# Patient Record
Sex: Female | Born: 1965 | Race: Black or African American | Hispanic: No | Marital: Married | Smoking: Current every day smoker
Health system: Southern US, Community
[De-identification: ages and names within clinical notes are randomized; demographics above are authoritative.]

## PROBLEM LIST (undated history)

## (undated) DIAGNOSIS — K259 Gastric ulcer, unspecified as acute or chronic, without hemorrhage or perforation: Secondary | ICD-10-CM

## (undated) DIAGNOSIS — M1990 Unspecified osteoarthritis, unspecified site: Secondary | ICD-10-CM

## (undated) DIAGNOSIS — H9190 Unspecified hearing loss, unspecified ear: Secondary | ICD-10-CM

## (undated) DIAGNOSIS — B059 Measles without complication: Secondary | ICD-10-CM

## (undated) DIAGNOSIS — E7800 Pure hypercholesterolemia, unspecified: Secondary | ICD-10-CM

## (undated) DIAGNOSIS — M810 Age-related osteoporosis without current pathological fracture: Secondary | ICD-10-CM

## (undated) DIAGNOSIS — E119 Type 2 diabetes mellitus without complications: Secondary | ICD-10-CM

## (undated) DIAGNOSIS — R42 Dizziness and giddiness: Secondary | ICD-10-CM

## (undated) DIAGNOSIS — G8929 Other chronic pain: Secondary | ICD-10-CM

## (undated) DIAGNOSIS — B269 Mumps without complication: Secondary | ICD-10-CM

## (undated) DIAGNOSIS — K219 Gastro-esophageal reflux disease without esophagitis: Secondary | ICD-10-CM

## (undated) DIAGNOSIS — I2510 Atherosclerotic heart disease of native coronary artery without angina pectoris: Secondary | ICD-10-CM

## (undated) HISTORY — PX: ABDOMINAL HYSTERECTOMY: SHX81

## (undated) HISTORY — PX: UTERINE FIBROID SURGERY: SHX826

## (undated) HISTORY — PX: TUBAL LIGATION: SHX77

## (undated) HISTORY — PX: OTHER SURGICAL HISTORY: SHX169

---

## 1998-05-21 ENCOUNTER — Emergency Department (HOSPITAL_COMMUNITY): Admission: EM | Admit: 1998-05-21 | Discharge: 1998-05-21 | Payer: Self-pay | Admitting: Emergency Medicine

## 1998-05-23 ENCOUNTER — Emergency Department (HOSPITAL_COMMUNITY): Admission: EM | Admit: 1998-05-23 | Discharge: 1998-05-23 | Payer: Self-pay | Admitting: Emergency Medicine

## 1998-05-24 ENCOUNTER — Emergency Department (HOSPITAL_COMMUNITY): Admission: EM | Admit: 1998-05-24 | Discharge: 1998-05-24 | Payer: Self-pay | Admitting: Emergency Medicine

## 1999-04-29 ENCOUNTER — Encounter: Payer: Self-pay | Admitting: Gastroenterology

## 1999-04-29 ENCOUNTER — Ambulatory Visit (HOSPITAL_COMMUNITY): Admission: RE | Admit: 1999-04-29 | Discharge: 1999-04-29 | Payer: Self-pay | Admitting: Gastroenterology

## 1999-05-04 ENCOUNTER — Ambulatory Visit (HOSPITAL_COMMUNITY): Admission: RE | Admit: 1999-05-04 | Discharge: 1999-05-04 | Payer: Self-pay | Admitting: Gastroenterology

## 1999-05-16 ENCOUNTER — Ambulatory Visit (HOSPITAL_COMMUNITY): Admission: RE | Admit: 1999-05-16 | Discharge: 1999-05-16 | Payer: Self-pay | Admitting: Gastroenterology

## 1999-05-16 ENCOUNTER — Encounter: Payer: Self-pay | Admitting: Gastroenterology

## 1999-05-16 ENCOUNTER — Inpatient Hospital Stay (HOSPITAL_COMMUNITY): Admission: EM | Admit: 1999-05-16 | Discharge: 1999-05-17 | Payer: Self-pay | Admitting: Emergency Medicine

## 1999-07-22 ENCOUNTER — Emergency Department (HOSPITAL_COMMUNITY): Admission: EM | Admit: 1999-07-22 | Discharge: 1999-07-23 | Payer: Self-pay

## 1999-12-06 ENCOUNTER — Emergency Department (HOSPITAL_COMMUNITY): Admission: EM | Admit: 1999-12-06 | Discharge: 1999-12-06 | Payer: Self-pay

## 1999-12-16 ENCOUNTER — Emergency Department (HOSPITAL_COMMUNITY): Admission: EM | Admit: 1999-12-16 | Discharge: 1999-12-16 | Payer: Self-pay | Admitting: *Deleted

## 2000-01-09 ENCOUNTER — Emergency Department (HOSPITAL_COMMUNITY): Admission: EM | Admit: 2000-01-09 | Discharge: 2000-01-09 | Payer: Self-pay | Admitting: Emergency Medicine

## 2000-08-22 ENCOUNTER — Other Ambulatory Visit: Admission: RE | Admit: 2000-08-22 | Discharge: 2000-08-22 | Payer: Self-pay | Admitting: Obstetrics and Gynecology

## 2000-08-22 ENCOUNTER — Encounter (INDEPENDENT_AMBULATORY_CARE_PROVIDER_SITE_OTHER): Payer: Self-pay

## 2000-11-17 ENCOUNTER — Emergency Department (HOSPITAL_COMMUNITY): Admission: EM | Admit: 2000-11-17 | Discharge: 2000-11-17 | Payer: Self-pay | Admitting: Emergency Medicine

## 2002-05-01 ENCOUNTER — Emergency Department (HOSPITAL_COMMUNITY): Admission: EM | Admit: 2002-05-01 | Discharge: 2002-05-01 | Payer: Self-pay | Admitting: Emergency Medicine

## 2002-05-01 ENCOUNTER — Encounter: Payer: Self-pay | Admitting: Emergency Medicine

## 2002-10-20 ENCOUNTER — Other Ambulatory Visit: Admission: RE | Admit: 2002-10-20 | Discharge: 2002-10-20 | Payer: Self-pay | Admitting: Obstetrics and Gynecology

## 2003-01-12 ENCOUNTER — Encounter (INDEPENDENT_AMBULATORY_CARE_PROVIDER_SITE_OTHER): Payer: Self-pay | Admitting: Specialist

## 2003-01-12 ENCOUNTER — Observation Stay (HOSPITAL_COMMUNITY): Admission: RE | Admit: 2003-01-12 | Discharge: 2003-01-13 | Payer: Self-pay | Admitting: Obstetrics and Gynecology

## 2008-09-08 ENCOUNTER — Emergency Department (HOSPITAL_COMMUNITY): Admission: EM | Admit: 2008-09-08 | Discharge: 2008-09-08 | Payer: Self-pay | Admitting: Emergency Medicine

## 2011-05-05 NOTE — H&P (Signed)
NAME:  Sylvia [name], [name]                          ACCOUNT NO.:  [id number]   MEDICAL RECORD NO.:  [id number]                   PATIENT TYPE:  AMB   LOCATION:  SDC                                  FACILITY:  [facility]   PHYSICIAN:  Janine Limbo, M.D.            DATE OF BIRTH:  22-Sep-1966   DATE OF ADMISSION:  01/12/2003  DATE OF DISCHARGE:                                HISTORY & PHYSICAL   HISTORY OF PRESENT ILLNESS:  The patient is a 45 year old female, para 2-0-2-  2, who presents for vaginal hysterectomy because of large fibroids with  menorrhagia, dysmenorrhea, irregular cycles, and anemia.  The patient had an  ultrasound performed which showed an 11 cm uterus with multiple fibroids.  The largest fibroid measured 4.1 x 3.4 cm.  The ovaries appeared normal on  that ultrasound.  The patient is currently taking iron and her hemoglobin  has been as low as 11.9.  Her gonorrhea and Chlamydia cultures were  negative.  An endometrial biopsy showed benign elements.  The patient tried  nonsteroidal anti-inflammatory agents and also hormonal therapy to control  her discomfort.  She continued to have her difficulties and now she wants to  proceed with hysterectomy.  The patient has had a left ectopic pregnancy  removed via laparoscopy.   OBSTETRICAL HISTORY:  The patient has had two term vaginal deliveries.  She  has had one left ectopic pregnancy removed via laparoscopy and one  miscarriage.   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  She had a  broken ankle in 1997.   DRUG ALLERGIES:  The patient is allergic to STEROID MEDICATIONS, DEMEROL,  and CONTRAST DYE.   SOCIAL HISTORY:  The patient smokes one half pack of cigarettes each day.  She drinks alcohol socially.  She denies recreational drug use.   REVIEW OF SYSTEMS:  The patient is deaf in her left ear as mentioned above.  She has a  history of frequent urinary tract infections.  She wears glasses  and contact lenses.   FAMILY HISTORY:  The patient's father had heart disease and hypertension.  Her grandmother also had hypertension.  Her sister had sickle cell anemia.   PHYSICAL EXAMINATION:  VITAL SIGNS:  Weight is 214 pounds.  HEENT:  Within normal limits.  CHEST:  Clear.  HEART:  Regular rate and rhythm.  BREASTS:  Without masses.  ABDOMEN:  Nontender.  EXTREMITIES:  Within normal limits.  NEUROLOGIC:  Grossly normal.  PELVIC:  External genitalia is normal.  The vagina is normal.  The cervix is  nontender.  The uterus is 12 weeks' size, irregular and firm.  Adnexa no  masses.  Rectovaginal exam confirms.   ASSESSMENT:  1. Fibroid uterus.  2. Menorrhagia.  3. Dysmenorrhea.  4. Anemia.   PLAN:  The  patient will undergo a vaginal hysterectomy.  She understands the  indications for her procedure and she accepts the risk of, but not limited  to, anesthetic complications, bleeding, infections, and possible damage to  the surrounding organs.                                               Janine Limbo, M.D.    AVS/MEDQ  D:  01/11/2003  T:  01/11/2003  Job:  [id number]

## 2011-05-05 NOTE — Op Note (Signed)
NAME:  [name], [name]                          ACCOUNT NO.:  [id number]   MEDICAL RECORD NO.:  [id number]                   PATIENT TYPE:  OBV   LOCATION:  9399                                 FACILITY:  [facility]   PHYSICIAN:  Janine Limbo, M.D.            DATE OF BIRTH:  [birth year]   DATE OF PROCEDURE:  01/12/2003  DATE OF DISCHARGE:                                 OPERATIVE REPORT   PREOPERATIVE DIAGNOSES:  1. Fibroid uterus.  2. Dysmenorrhea.  3. Menorrhagia.   POSTOPERATIVE DIAGNOSES:  1. Fibroid uterus.  2. Dysmenorrhea.  3. Menorrhagia.   PROCEDURE:  Vaginal hysterectomy with uterine morcellation.   SURGEON:  Janine Limbo, M.D.   ASSISTANT:  Marquis Lunch. Adline Peals.   ANESTHESIA:  General.   DISPOSITION:  The patient is a 45 year old female, para 2-0-2-2, who  presents with the above-mentioned diagnosis.  Nonsteroidal anti-inflammatory  agents and hormonal therapy have not relieved her discomfort.  She wishes to  proceed with definitive therapy.  She understands the indications for her  procedure and she accepts the risks of, but not limited to, anesthetic  complications, bleeding, infection, and possible damage to the surrounding  organs.   FINDINGS:  The patient was noted to have a 14 week size multifibroid uterus.  The ovaries appeared normal.   DESCRIPTION OF PROCEDURE:  The patient was taken to the operating room,  where a general anesthetic was given.  The patient's abdomen, perineum, and  vagina were prepped with multiple layers of Betadine.  A Foley catheter was  placed in the bladder.  The patient was then sterilely draped.  Examination  under anesthesia was performed.  The cervix was injected with a diluted  solution of Pitressin and saline.  A circumferential incision was made  around the cervix and the vaginal mucosa was advanced both anteriorly and  posteriorly.  The posterior cul-de-sac and then the anterior cul-de-sac were  sharply entered.   Alternating from right to left, the uterosacral ligaments,  paracervical tissues, parametrial tissues, and uterine arteries were  clamped, cut, sutured, and tied securely.  Attempts were made to invert the  uterus through the posterior colpotomy, but these were unsuccessful because  of the size of the uterus.  In a stepwise fashion, the posterior uterus was  incised and multiple fibroids were removed.  We were careful to keep the  uterus always under direct visualization and our incisions always under  direct visualization.  Once we had reached the fundus of  the uterus, we  were then able to invert the uterus through the posterior colpotomy.  The  upper pedicles were secured and the uterus was transected from the operative  field.  The upper pedicles were then free-tied and then suture ligated.  Figure-of-eight sutures of 0 Vicryl and then 2-0 Vicryl were used for  hemostasis.  Once we were certain that hemostasis was adequate, the sutures  attached to the uterosacral ligaments were brought out through the vaginal  angles and tied securely.  A McCall culdoplasty suture was placed in the  posterior cul-de-sac, incorporating the uterosacral ligaments bilaterally  and the posterior vaginal mucosa.  A final check was made for hemostasis,  and again hemostasis was confirmed.  The vaginal cuff was closed using  figure-of-eight incorporating the anterior vaginal mucosa, the anterior  peritoneum, the posterior peritoneum, and the posterior vaginal mucosa.  The  McCall culdoplasty suture was tied securely and the apex of the vagina was  noted to elevate into the mid-pelvis.  Sponge, needle, and instrument counts  were correct on two occasions.  The estimated blood loss was 200 mL.  Vicryl  0 was the suture material used except where otherwise mentioned.  The  patient was noted to drain clear yellow urine at the end of her procedure.  She was taken to the recovery room in stable condition.                                                Janine Limbo, M.D.    AVS/MEDQ  D:  01/12/2003  T:  01/12/2003  Job:  [id number]

## 2011-05-05 NOTE — Discharge Summary (Signed)
NAME:  Sylvia [name], Sylvia [name]                          ACCOUNT NO.:  [id number]   MEDICAL RECORD NO.:  [id number]                   PATIENT TYPE:  OBV   LOCATION:  9148                                 FACILITY:  [facility]   PHYSICIAN:  Janine Limbo, M.D.            DATE OF BIRTH:  06-24-1966   DATE OF ADMISSION:  01/12/2003  DATE OF DISCHARGE:  01/13/2003                                 DISCHARGE SUMMARY   DISCHARGE DIAGNOSES:  1. Fibroid uterus.  2. Menorrhagia.  3. Dysmenorrhea.   OPERATIONS:  On the date of admission the patient underwent [name] total vaginal  hysterectomy with uterine morcellations, tolerating the procedure well.  The  patient was found to have [name] multiple fibroid uterus enlarged to  approximately 14 weeks size, (250 grams), along with normal-appearing tubes  and ovaries.   HISTORY OF PRESENT ILLNESS:  The patient is [name] 45 year old female, para 2-0-2-  2, who presents for vaginal hysterectomy because of large fibroids with  menorrhagia, dysmenorrhea, irregular cycles and anemia.  Please see the  patient's dictated history and physical examination for details.   PHYSICAL EXAMINATION:  VITAL SIGNS:  Weight 214 pounds.  GENERAL:  Within normal limits.  PELVIC:  External genitalia is normal.  Vagina is normal.  Cervix is  nontender.  Uterus is approximately 12 week size, irregular and firm.  Adnexa without masses, rectovaginal exam confirms.   HOSPITAL COURSE:  On the date of admission the patient underwent  aforementioned procedures, tolerating them well.  Postoperative course was  unremarkable with patient's postoperative hemoglobin being 10.1  (preoperative hemoglobin 12.6).  The patient had resumed bowel and bladder  function by postoperative day one and therefore deemed ready for discharge  home.   DISCHARGE MEDICATIONS:  1. Ibuprofen 600 mg 1 tablet with food every 6 hours for 5 days, then as     needed for pain.  2. Colace 100 mg 1 tablet twice daily until bowel  movements are regular.  3. Vicodin 1-2 tablets every 4-6 hours as needed for pain.  4. Phenergan 12.5 mg 1 tablet every 6 hours as needed for nausea.  5. Iron 325 mg 1 tablet twice daily for 6 weeks.   FOLLOWUP:  The patient is scheduled for [name] six weeks postoperative exam with  Dr. Stefano Gaul on 02/24/03 at 3 o'clock p.m.   DISCHARGE INSTRUCTIONS:  1. The patient was given [name] copy of [facility] and     Gynecology Postoperative Instruction Sheet.  2. She was further advised to avoid driving for two weeks, heavy lifting for     four weeks, and intercourse for six     weeks.  3. The patient's diet was without restriction.  4. Final pathology was not available at the time of the patient's discharge.     Elmira J. Powell, P.[name].  Janine Limbo, M.D.    EJP/MEDQ  D:  01/13/2003  T:  01/13/2003  Job:  [id number]

## 2012-12-18 HISTORY — PX: JOINT REPLACEMENT: SHX530

## 2015-04-30 ENCOUNTER — Ambulatory Visit: Payer: Medicare PPO | Admitting: Internal Medicine

## 2015-04-30 DIAGNOSIS — Z0289 Encounter for other administrative examinations: Secondary | ICD-10-CM

## 2015-09-08 ENCOUNTER — Emergency Department (HOSPITAL_BASED_OUTPATIENT_CLINIC_OR_DEPARTMENT_OTHER)
Admission: EM | Admit: 2015-09-08 | Discharge: 2015-09-08 | Disposition: A | Discharge status: Home / Self Care | Payer: Medicare PPO | Attending: Emergency Medicine | Admitting: Emergency Medicine

## 2015-09-08 ENCOUNTER — Encounter (HOSPITAL_BASED_OUTPATIENT_CLINIC_OR_DEPARTMENT_OTHER): Payer: Self-pay

## 2015-09-08 DIAGNOSIS — X58XXXA Exposure to other specified factors, initial encounter: Secondary | ICD-10-CM | POA: Diagnosis not present

## 2015-09-08 DIAGNOSIS — Y998 Other external cause status: Secondary | ICD-10-CM | POA: Diagnosis not present

## 2015-09-08 DIAGNOSIS — S161XXA Strain of muscle, fascia and tendon at neck level, initial encounter: Secondary | ICD-10-CM | POA: Insufficient documentation

## 2015-09-08 DIAGNOSIS — I2510 Atherosclerotic heart disease of native coronary artery without angina pectoris: Secondary | ICD-10-CM | POA: Insufficient documentation

## 2015-09-08 DIAGNOSIS — Y9389 Activity, other specified: Secondary | ICD-10-CM | POA: Diagnosis not present

## 2015-09-08 DIAGNOSIS — Y9289 Other specified places as the place of occurrence of the external cause: Secondary | ICD-10-CM | POA: Diagnosis not present

## 2015-09-08 DIAGNOSIS — E119 Type 2 diabetes mellitus without complications: Secondary | ICD-10-CM | POA: Insufficient documentation

## 2015-09-08 DIAGNOSIS — M542 Cervicalgia: Secondary | ICD-10-CM | POA: Diagnosis present

## 2015-09-08 HISTORY — DX: Type 2 diabetes mellitus without complications: E11.9

## 2015-09-08 HISTORY — DX: Pure hypercholesterolemia, unspecified: E78.00

## 2015-09-08 HISTORY — DX: Atherosclerotic heart disease of native coronary artery without angina pectoris: I25.10

## 2015-09-08 MED ORDER — KETOROLAC TROMETHAMINE 60 MG/2ML IM SOLN
60.0000 mg | Freq: Once | INTRAMUSCULAR | Status: AC
  Administered 2015-09-08: 60 mg via INTRAMUSCULAR
  Filled 2015-09-08: qty 2

## 2015-09-08 MED ORDER — METHOCARBAMOL 1000 MG/10ML IJ SOLN
1000.0000 mg | Freq: Once | INTRAMUSCULAR | Status: AC
  Administered 2015-09-08: 1000 mg via INTRAMUSCULAR
  Filled 2015-09-08: qty 10

## 2015-09-08 MED ORDER — METHOCARBAMOL 500 MG PO TABS: 500.0000 mg | ORAL_TABLET | Freq: Three times a day (TID) | ORAL | Status: DC | PRN

## 2015-09-08 MED ORDER — IBUPROFEN 400 MG PO TABS: 400.0000 mg | ORAL_TABLET | Freq: Four times a day (QID) | ORAL | Status: DC | PRN

## 2015-09-08 NOTE — ED Notes (Signed)
Pt c/o neck and shoulder pain since Sunday; denies injury, states slept wrong; pt in tears from pain, states pain on any movement

## 2015-09-08 NOTE — ED Provider Notes (Signed)
CSN: [id number]     Arrival date & time 09/08/15  0622 History   First MD Initiated Contact with Patient 09/08/15 [phone]     Chief Complaint  Patient presents with  . Neck Pain     (Consider location/radiation/quality/duration/timing/severity/associated sxs/prior Treatment) The history is provided by the patient and the spouse.   49 yo F w/ 3 days of worsening neck, trapezius pain without neurologic symptoms or fever. Seen at [facility] recently and dc w/ norco which didn't help. No other associated symptoms. Feels similar to previous muscle strains.   Past Medical History  Diagnosis Date  . Diabetes mellitus without complication   . High cholesterol   . Coronary artery disease    Past Surgical History  Procedure Laterality Date  . Abdominal hysterectomy     No family history on file. Social History  Substance Use Topics  . Smoking status: Never Smoker   . Smokeless tobacco: None  . Alcohol Use: Yes   OB History    No data available     Review of Systems  Constitutional: Negative for fever and chills.  Eyes: Negative for pain and itching.  Respiratory: Negative for shortness of breath (not recently).   Endocrine: Negative for polydipsia and polyuria.  Musculoskeletal: Positive for myalgias, back pain and neck pain.  All other systems reviewed and are negative.     Allergies  Contrast media and Demerol  Home Medications   Prior to Admission medications   Medication Sig Start Date End Date Taking? Authorizing Provider  ibuprofen (ADVIL,MOTRIN) 400 MG tablet Take 1 tablet (400 mg total) by mouth every 6 (six) hours as needed. 09/08/15   Marily Memos, MD  methocarbamol (ROBAXIN) 500 MG tablet Take 1 tablet (500 mg total) by mouth every 8 (eight) hours as needed for muscle spasms. 09/08/15   Barbara Cower Mesner, MD   BP 161/94 mmHg  Pulse 102  Temp(Src) 98.3 F (36.8 C) (Oral)  Resp 20  Ht  (1.6 m)  Wt 240 lb (108.863 kg)  BMI 42.52 kg/m2  SpO2 98% Physical Exam   Constitutional: She is oriented to person, place, and time. She appears well-developed and well-nourished.  HENT:  Head: Normocephalic and atraumatic.  Eyes: Conjunctivae and EOM are normal. Right eye exhibits no discharge. Left eye exhibits no discharge.  Cardiovascular: Normal rate and regular rhythm.   Pulmonary/Chest: Effort normal and breath sounds normal. No respiratory distress.  Abdominal: Soft. She exhibits no distension. There is no tenderness. There is no rebound.  Musculoskeletal: Normal range of motion. She exhibits no edema or tenderness.  Neurological: She is alert and oriented to person, place, and time.  Skin: Skin is warm and dry.  Nursing note and vitals reviewed.   ED Course  Procedures (including critical care time) Labs Review Labs Reviewed - No data to display  Imaging Review No results found. I have personally reviewed and evaluated these images and lab results as part of my medical decision-making.   EKG Interpretation None      MDM   Final diagnoses:  Neck strain, initial encounter   Likely MSK strain. Doubt vertebral dissection, spinal or vertebral pathology. Will tx symptomatically. Patient has to go to work and doesn't want anything sedating.   Pain improved with symptomatic treatment. Ambulating around room without difficulty. No neuro changes. Likely strain.     Marily Memos, MD 09/08/15 [phone]

## 2016-01-24 ENCOUNTER — Ambulatory Visit: Payer: Self-pay | Admitting: Orthopedic Surgery

## 2016-01-24 NOTE — Progress Notes (Signed)
Preoperative surgical orders have been place into the Epic hospital system for [employer] on 01/24/2016, 11:30 AM  by Patrica Duel for surgery on 03-01-16.  Preop Total Hip - Anterior Approach orders including IV Tylenol, and IV Decadron as long as there are no contraindications to the above medications. Avel Peace, PA-C

## 2016-02-22 ENCOUNTER — Other Ambulatory Visit (HOSPITAL_COMMUNITY): Payer: Self-pay | Admitting: *Deleted

## 2016-02-22 NOTE — Patient Instructions (Addendum)
Sylvia [name]  02/22/2016   Your procedure is scheduled on: 03-01-16  Report to [facility]  [facility]  [facility] to 3rd floor to  Short Stay Center at 845 AM.  Call this number if you have problems the morning of surgery [phone]   Remember: ONLY 1 PERSON MAY GO WITH YOU TO SHORT STAY TO GET  READY MORNING OF YOUR SURGERY.  Do not eat food or drink liquids :After Midnight.     Take these medicines the morning of surgery with A SIP OF WATER:ATORVASTATIN (LIPITOR) DO NOT TAKE ANY DIABETIC MEDICATIONS DAY OF YOUR SURGERY                               You may not have any metal on your body including hair pins and              piercings  Do not wear jewelry, make-up, lotions, powders or perfumes, deodorant             Do not wear nail polish.  Do not shave  48 hours prior to surgery.              Men may shave face and neck.   Do not bring valuables to the hospital. [place] IS NOT             RESPONSIBLE   FOR VALUABLES.  Contacts, dentures or bridgework may not be worn into surgery.  Leave suitcase in the car. After surgery it may be brought to your room.                  Please read over the following fact sheets you were given: _____________________________________________________________________             [facility] - Preparing [facility] Before surgery, you can play an important role.  Because skin is not sterile, your skin needs to be as free of germs as possible.  You can reduce the number of germs on your skin by washing with CHG (chlorahexidine gluconate) soap before surgery.  CHG is an antiseptic cleaner which kills germs and bonds with the skin to continue killing germs even after washing. Please DO NOT use if you have an allergy to CHG or antibacterial soaps.  If your skin becomes reddened/irritated stop using the CHG and inform your nurse when you arrive at Short Stay. Do not shave (including legs and underarms) for at  least 48 hours prior to the first CHG shower.  You may shave your face/neck. Please follow these instructions carefully:  1.  Shower with CHG Soap the night before surgery and the  morning of Surgery.  2.  If you choose to wash your hair, wash your hair first as usual with your  normal  shampoo.  3.  After you shampoo, rinse your hair and body thoroughly to remove the  shampoo.                           4.  Use CHG as you would any other liquid soap.  You can apply chg directly  to the skin and wash                       Gently with a scrungie or  clean washcloth.  5.  Apply the CHG Soap to your body ONLY FROM THE NECK DOWN.   Do not use on face/ open                           Wound or open sores. Avoid contact with eyes, ears mouth and genitals (private parts).                       Wash face,  Genitals (private parts) with your normal soap.             6.  Wash thoroughly, paying special attention to the area where your surgery  will be performed.  7.  Thoroughly rinse your body with warm water from the neck down.  8.  DO NOT shower/wash with your normal soap after using and rinsing off  the CHG Soap.                9.  Pat yourself dry with a clean towel.            10.  Wear clean pajamas.            11.  Place clean sheets on your bed the night of your first shower and do not  sleep with pets. Day of Surgery : Do not apply any lotions/deodorants the morning of surgery.  Please wear clean clothes to the hospital/surgery center.  FAILURE TO FOLLOW THESE INSTRUCTIONS MAY RESULT IN THE CANCELLATION OF YOUR SURGERY PATIENT SIGNATURE_________________________________  NURSE SIGNATURE__________________________________  ________________________________________________________________________   Adam Phenix  An incentive spirometer is a tool that can help keep your lungs clear and active. This tool measures how well you are filling your lungs with each breath. Taking long deep breaths  may help reverse or decrease the chance of developing breathing (pulmonary) problems (especially infection) following:  A long period of time when you are unable to move or be active. BEFORE THE PROCEDURE   If the spirometer includes an indicator to show your best effort, your nurse or respiratory therapist will set it to a desired goal.  If possible, sit up straight or lean slightly forward. Try not to slouch.  Hold the incentive spirometer in an upright position. INSTRUCTIONS FOR USE   Sit on the edge of your bed if possible, or sit up as far as you can in bed or on a chair.  Hold the incentive spirometer in an upright position.  Breathe out normally.  Place the mouthpiece in your mouth and seal your lips tightly around it.  Breathe in slowly and as deeply as possible, raising the piston or the ball toward the top of the column.  Hold your breath for 3-5 seconds or for as long as possible. Allow the piston or ball to fall to the bottom of the column.  Remove the mouthpiece from your mouth and breathe out normally.  Rest for a few seconds and repeat Steps 1 through 7 at least 10 times every 1-2 hours when you are awake. Take your time and take a few normal breaths between deep breaths.  The spirometer may include an indicator to show your best effort. Use the indicator as a goal to work toward during each repetition.  After each set of 10 deep breaths, practice coughing to be sure your lungs are clear. If you have an incision (the cut made at the time of surgery), support your incision when coughing  by placing a pillow or rolled up towels firmly against it. Once you are able to get out of bed, walk around indoors and cough well. You may stop using the incentive spirometer when instructed by your caregiver.  RISKS AND COMPLICATIONS  Take your time so you do not get dizzy or light-headed.  If you are in pain, you may need to take or ask for pain medication before doing incentive  spirometry. It is harder to take a deep breath if you are having pain. AFTER USE  Rest and breathe slowly and easily.  It can be helpful to keep track of a log of your progress. Your caregiver can provide you with a simple table to help with this. If you are using the spirometer at home, follow these instructions: [place] IF:   You are having difficultly using the spirometer.  You have trouble using the spirometer as often as instructed.  Your pain medication is not giving enough relief while using the spirometer.  You develop fever of 100.5 F (38.1 C) or higher. SEEK IMMEDIATE MEDICAL CARE IF:   You cough up bloody sputum that had not been present before.  You develop fever of 102 F (38.9 C) or greater.  You develop worsening pain at or near the incision site. MAKE SURE YOU:   Understand these instructions.  Will watch your condition.  Will get help right away if you are not doing well or get worse. Document Released: 04/16/2007 Document Revised: 02/26/2012 Document Reviewed: 06/17/2007 ExitCare Patient Information 2014 ExitCare, [place].   ________________________________________________________________________  WHAT IS A BLOOD TRANSFUSION? Blood Transfusion Information  A transfusion is the replacement of blood or some of its parts. Blood is made up of multiple cells which provide different functions.  Red blood cells carry oxygen and are used for blood loss replacement.  White blood cells fight against infection.  Platelets control bleeding.  Plasma helps clot blood.  Other blood products are available for specialized needs, such as hemophilia or other clotting disorders. BEFORE THE TRANSFUSION  Who gives blood for transfusions?   Healthy volunteers who are fully evaluated to make sure their blood is safe. This is blood bank blood. Transfusion therapy is the safest it has ever been in the practice of medicine. Before blood is taken from a donor, a  complete history is taken to make sure that person has no history of diseases nor engages in risky social behavior (examples are intravenous drug use or sexual activity with multiple partners). The donor's travel history is screened to minimize risk of transmitting infections, such as malaria. The donated blood is tested for signs of infectious diseases, such as HIV and hepatitis. The blood is then tested to be sure it is compatible with you in order to minimize the chance of a transfusion reaction. If you or a relative donates blood, this is often done in anticipation of surgery and is not appropriate for emergency situations. It takes many days to process the donated blood. RISKS AND COMPLICATIONS Although transfusion therapy is very safe and saves many lives, the main dangers of transfusion include:   Getting an infectious disease.  Developing a transfusion reaction. This is an allergic reaction to something in the blood you were given. Every precaution is taken to prevent this. The decision to have a blood transfusion has been considered carefully by your caregiver before blood is given. Blood is not given unless the benefits outweigh the risks. AFTER THE TRANSFUSION  Right after receiving a  blood transfusion, you will usually feel much better and more energetic. This is especially true if your red blood cells have gotten low (anemic). The transfusion raises the level of the red blood cells which carry oxygen, and this usually causes an energy increase.  The nurse administering the transfusion will monitor you carefully for complications. HOME CARE INSTRUCTIONS  No special instructions are needed after a transfusion. You may find your energy is better. Speak with your caregiver about any limitations on activity for underlying diseases you may have. SEEK MEDICAL CARE IF:   Your condition is not improving after your transfusion.  You develop redness or irritation at the intravenous (IV)  site. SEEK IMMEDIATE MEDICAL CARE IF:  Any of the following symptoms occur over the next 12 hours:  Shaking chills.  You have a temperature by mouth above 102 F (38.9 C), not controlled by medicine.  Chest, back, or muscle pain.  People around you feel you are not acting correctly or are confused.  Shortness of breath or difficulty breathing.  Dizziness and fainting.  You get a rash or develop hives.  You have a decrease in urine output.  Your urine turns a dark color or changes to pink, red, or brown. Any of the following symptoms occur over the next 10 days:  You have a temperature by mouth above 102 F (38.9 C), not controlled by medicine.  Shortness of breath.  Weakness after normal activity.  The white part of the eye turns yellow (jaundice).  You have a decrease in the amount of urine or are urinating less often.  Your urine turns a dark color or changes to pink, red, or brown. Document Released: 12/01/2000 Document Revised: 02/26/2012 Document Reviewed: 07/20/2008 [facility] Patient Information 2014 [place], [place].  _______________________________________________________________________

## 2016-02-22 NOTE — Progress Notes (Signed)
[facility] 09-06-15 on chart e[facility] 09-06-15 [facility] chart

## 2016-02-23 ENCOUNTER — Encounter (HOSPITAL_COMMUNITY): Payer: Self-pay

## 2016-02-23 ENCOUNTER — Encounter (HOSPITAL_COMMUNITY)
Admission: RE | Admit: 2016-02-23 | Discharge: 2016-02-23 | Disposition: A | Discharge status: Home / Self Care | Payer: Medicare PPO | Source: Ambulatory Visit | Attending: Orthopedic Surgery | Admitting: Orthopedic Surgery

## 2016-02-23 DIAGNOSIS — Z0183 Encounter for blood typing: Secondary | ICD-10-CM | POA: Insufficient documentation

## 2016-02-23 DIAGNOSIS — Z01818 Encounter for other preprocedural examination: Secondary | ICD-10-CM | POA: Diagnosis present

## 2016-02-23 DIAGNOSIS — M1612 Unilateral primary osteoarthritis, left hip: Secondary | ICD-10-CM | POA: Diagnosis not present

## 2016-02-23 DIAGNOSIS — Z01812 Encounter for preprocedural laboratory examination: Secondary | ICD-10-CM | POA: Insufficient documentation

## 2016-02-23 HISTORY — DX: Other chronic pain: G89.29

## 2016-02-23 HISTORY — DX: Gastro-esophageal reflux disease without esophagitis: K21.9

## 2016-02-23 HISTORY — DX: Age-related osteoporosis without current pathological fracture: M81.0

## 2016-02-23 HISTORY — DX: Mumps without complication: B26.9

## 2016-02-23 HISTORY — DX: Unspecified osteoarthritis, unspecified site: M19.90

## 2016-02-23 HISTORY — DX: Measles without complication: B05.9

## 2016-02-23 HISTORY — DX: Gastric ulcer, unspecified as acute or chronic, without hemorrhage or perforation: K25.9

## 2016-02-23 HISTORY — DX: Unspecified hearing loss, unspecified ear: H91.90

## 2016-02-23 HISTORY — DX: Dizziness and giddiness: R42

## 2016-02-23 LAB — URINALYSIS, ROUTINE W REFLEX MICROSCOPIC
Bilirubin Urine: NEGATIVE
GLUCOSE, UA: NEGATIVE mg/dL
HGB URINE DIPSTICK: NEGATIVE
Ketones, ur: NEGATIVE mg/dL
Leukocytes, UA: NEGATIVE
Nitrite: NEGATIVE
PH: 5.5 (ref 5.0–8.0)
Protein, ur: NEGATIVE mg/dL
SPECIFIC GRAVITY, URINE: 1.015 (ref 1.005–1.030)

## 2016-02-23 LAB — COMPREHENSIVE METABOLIC PANEL
ALK PHOS: 71 U/L (ref 38–126)
ALT: 29 U/L (ref 14–54)
ANION GAP: 10 (ref 5–15)
AST: 23 U/L (ref 15–41)
Albumin: 3.5 g/dL (ref 3.5–5.0)
BILIRUBIN TOTAL: 0.7 mg/dL (ref 0.3–1.2)
BUN: 14 mg/dL (ref 6–20)
CALCIUM: 9.3 mg/dL (ref 8.9–10.3)
CO2: 27 mmol/L (ref 22–32)
Chloride: 105 mmol/L (ref 101–111)
Creatinine, Ser: 1 mg/dL (ref 0.44–1.00)
Glucose, Bld: 158 mg/dL — ABNORMAL HIGH (ref 65–99)
Potassium: 4.1 mmol/L (ref 3.5–5.1)
Sodium: 142 mmol/L (ref 135–145)
TOTAL PROTEIN: 7.7 g/dL (ref 6.5–8.1)

## 2016-02-23 LAB — PROTIME-INR
INR: 1.12 (ref 0.00–1.49)
PROTHROMBIN TIME: 14.2 s (ref 11.6–15.2)

## 2016-02-23 LAB — CBC
HEMATOCRIT: 37.3 % (ref 36.0–46.0)
HEMOGLOBIN: 11.7 g/dL — AB (ref 12.0–15.0)
MCH: 28.1 pg (ref 26.0–34.0)
MCHC: 31.4 g/dL (ref 30.0–36.0)
MCV: 89.7 fL (ref 78.0–100.0)
Platelets: 276 10*3/uL (ref 150–400)
RBC: 4.16 MIL/uL (ref 3.87–5.11)
RDW: 14.3 % (ref 11.5–15.5)
WBC: 8 10*3/uL (ref 4.0–10.5)

## 2016-02-23 LAB — APTT: aPTT: 28 seconds (ref 24–37)

## 2016-02-23 LAB — SURGICAL PCR SCREEN
MRSA, PCR: NEGATIVE
Staphylococcus aureus: POSITIVE — AB

## 2016-02-23 LAB — ABO/RH: ABO/RH(D): A POS

## 2016-02-24 LAB — HEMOGLOBIN A1C
Hgb A1c MFr Bld: 6.9 % — ABNORMAL HIGH (ref 4.8–5.6)
MEAN PLASMA GLUCOSE: 151 mg/dL

## 2016-02-29 ENCOUNTER — Ambulatory Visit: Payer: Self-pay | Admitting: Orthopedic Surgery

## 2016-02-29 NOTE — H&P (Signed)
Sylvia [name] DOB: 07/31/1966 Married / Language: English / Race: White Female Date of Admission:  03/01/2016 CC:  Left Hip Pain History of Present Illness  The patient is a 50 year old female who comes in today for a preoperative History and Physical. The patient is scheduled for a left total hip arthroplasty (anterior) to be performed by Dr. Gus RankinFrank V. Aluisio, MD at [facility] on 03-01-2016. The patient is a 50 year old female who presented with a hip problem. The patient was seen referral from Dr. Bernita Raisinavid [place].The patient reports left lateral hip problems including pain symptoms that have been present for 2 year(s). The symptoms began without any known injury. Symptoms reported include hip pain, weakness, stiffness, catching, difficulty flexing hip and difficulty ambulating The patient reports symptoms radiating to the: left groin and left knee (and occasionally to her foot).The patient feels as if their symptoms are does feel they are worsening. Symptoms are exacerbated by flexing hip. Prior to being seen, the patient was previously evaluated by a primary physician. Previous treatment for this problem has included nonsteroidal anti-inflammatory drugs (Voltaren 75mg ) and opioid analgesics (hydrocodone; along with Tizanidine as needed. She reports she is out of the pain medication). She had her other hip [place] [facility]. She states she did well with that. She would prefer to have any further surgery [place] in [place]. The left hip is bothering her at all times now. Says it is as bad as the right one was prior to when she had that replaced. She is hurting at night. She cannot get around and do what she desires. She would like to exercise and lose weight, but cannot do so because of this left hip. She has got advanced end stage arthritis of the left hip with progressively worsening pain and dysfunction. At this point, the most predictable means of improving her  pain and function would be total hip arthroplasty. They have been treated conservatively in the past for the above stated problem and despite conservative measures, they continue to have progressive pain and severe functional limitations and dysfunction. They have failed non-operative management including home exercise, medications. It is felt that they would benefit from undergoing total joint replacement. Risks and benefits of the procedure have been discussed with the patient and they elect to proceed with surgery. There are no active contraindications to surgery such as ongoing infection or rapidly progressive neurological disease.  Problem List/Past Medical  Pain of left hip joint (M25.552)  Primary osteoarthritis of left hip (M16.12)  Chronic Pain  Diabetes Mellitus, Type II  Hypercholesterolemia  Osteoarthritis  Osteoporosis  Vertigo  Impaired Hearing  Hypercholesterolemia  Gastric Ulcer  Gastroesophageal Reflux Disease  Degenerative Disc Disease  Measles  Mumps  Allergies No Known Drug Allergies  Intolerances Demerol *ANALGESICS - OPIOID*  Vomiting. Contrast Media Ready-Box *MEDICAL DEVICES AND SUPPLIES*  Convulsions  Family History Cancer  Maternal Grandmother. Drug / Alcohol Addiction  Brother, Father, Maternal Grandmother, Sister. Osteoarthritis  Father. Rheumatoid Arthritis  Father.  Social History  Children  2 Current work status  disabled Exercise  Exercises daily; does running / walking Former drinker  10/28/2015: In the past drank wine only occasionally per week Living situation  live with spouse Marital status  married No history of drug/alcohol rehab  Not under pain contract  Number of flights of stairs before winded  2-3 Tobacco use  Former smoker. 10/28/2015: smoke(d) less than 1/2 pack(s) per day Post-Surgical Plans  Home  Medication History Diclofenac (  Tablet, Oral) Active. GlipiZIDE XL (  Tablet ER 24HR,  Oral) Active. Calcium + D (548 147 5159-40MG -UNT-MCG Tablet Chewable, Oral) Active. Atorvastatin Calcium (  Tablet, Oral daily) Active.  Past Surgical History Hysterectomy  partial (non-cancerous) Spinal Surgery  Disc Surgery Total Hip Replacement  right Tubal Ligation  Fibroid Surgery     Review of Systems General Not Present- Chills, Fatigue, Fever, Memory Loss, Night Sweats, Weight Gain and Weight Loss. Skin Not Present- Eczema, Hives, Itching, Lesions and Rash. HEENT Not Present- Dentures, Double Vision, Headache, Hearing Loss, Tinnitus and Visual Loss. Respiratory Not Present- Allergies, Chronic Cough, Coughing up blood, Shortness of breath at rest and Shortness of breath with exertion. Cardiovascular Not Present- Chest Pain, Difficulty Breathing Lying Down, Murmur, Palpitations, Racing/skipping heartbeats and Swelling. Gastrointestinal Not Present- Abdominal Pain, Bloody Stool, Constipation, Diarrhea, Difficulty Swallowing, Heartburn, Jaundice, Loss of appetitie, Nausea and Vomiting. Female Genitourinary Not Present- Blood in Urine, Discharge, Flank Pain, Incontinence, Painful Urination, Urgency, Urinary frequency, Urinary Retention, Urinating at Night and Weak urinary stream. Musculoskeletal Not Present- Back Pain, Joint Pain, Joint Swelling, Morning Stiffness, Muscle Pain, Muscle Weakness and Spasms. Neurological Not Present- Blackout spells, Difficulty with balance, Dizziness, Paralysis, Tremor and Weakness. Psychiatric Not Present- Insomnia.  Vitals  Height: 63in Pulse: 88 (Regular)  Resp.: 20 (Unlabored)  BP: 126/62 (Sitting, Left Arm, Standard)       Physical Exam General Mental Status -Alert, cooperative and good historian. General Appearance-pleasant, Not in acute distress. Orientation-Oriented X3. Build & Nutrition-Well nourished and Well developed.  Head and Neck Head-normocephalic, atraumatic . Neck Global Assessment - supple, no  bruit auscultated on the right, no bruit auscultated on the left.  Eye Vision-Wears corrective lenses. Pupil - Bilateral-Regular and Round. Motion - Bilateral-EOMI.  Chest and Lung Exam Auscultation Breath sounds - clear at anterior chest wall and clear at posterior chest wall. Adventitious sounds - No Adventitious sounds.  Cardiovascular Auscultation Rhythm - Regular rate and rhythm. Heart Sounds - S1 WNL and S2 WNL. Murmurs & Other Heart Sounds - Auscultation of the heart reveals - No Murmurs.  Abdomen Palpation/Percussion Tenderness - Abdomen is non-tender to palpation. Rigidity (guarding) - Abdomen is soft. Auscultation Auscultation of the abdomen reveals - Bowel sounds normal.  Female Genitourinary Note: Not done, not pertinent to present illness   Musculoskeletal Note: On exam, she is alert and oriented, in no apparent distress. Right hip can flexed to about 100, rotate in 20, out 30, abduct 30 without discomfort. Left hip flexion to 90, no internal or external rotation, about 20 degrees of abduction. Knee exam is unremarkable. She has intact pulses and sensation. Motor is intact. Gait pattern is significantly antalgic on the left.  RADIOGRAPHS AP of the pelvis and lateral of the left hip shows severe end stage arthritis, bone-on-bone near ankylosed joint. The right hip replacements in good position, no abnormalities.   Assessment & Plan  Primary osteoarthritis of left hip (M16.12)  Note:Surgical Plans: Left Total Hip Replacement - Anterior Approach  Disposition: Home  PCP: Dr. Bernita Raisin  IV TXA  Anesthesia Issues: None  Signed electronically by Beckey Rutter, III PA-C

## 2016-03-01 ENCOUNTER — Inpatient Hospital Stay (HOSPITAL_COMMUNITY)
Admission: AD | Admit: 2016-03-01 | Discharge: 2016-03-03 | DRG: 470 | Disposition: A | Discharge status: Home Health | Payer: Medicare PPO | Source: Ambulatory Visit | Attending: Orthopedic Surgery | Admitting: Orthopedic Surgery

## 2016-03-01 ENCOUNTER — Inpatient Hospital Stay (HOSPITAL_COMMUNITY): Payer: Medicare PPO

## 2016-03-01 ENCOUNTER — Inpatient Hospital Stay (HOSPITAL_COMMUNITY): Payer: Medicare PPO | Admitting: Anesthesiology

## 2016-03-01 ENCOUNTER — Encounter (HOSPITAL_COMMUNITY): Payer: Self-pay | Admitting: *Deleted

## 2016-03-01 ENCOUNTER — Encounter (HOSPITAL_COMMUNITY)
Admission: AD | Disposition: A | Discharge status: Home Health | Payer: Self-pay | Source: Ambulatory Visit | Attending: Orthopedic Surgery

## 2016-03-01 DIAGNOSIS — K219 Gastro-esophageal reflux disease without esophagitis: Secondary | ICD-10-CM | POA: Diagnosis present

## 2016-03-01 DIAGNOSIS — M810 Age-related osteoporosis without current pathological fracture: Secondary | ICD-10-CM | POA: Diagnosis present

## 2016-03-01 DIAGNOSIS — M25552 Pain in left hip: Secondary | ICD-10-CM | POA: Diagnosis present

## 2016-03-01 DIAGNOSIS — E7800 Pure hypercholesterolemia, unspecified: Secondary | ICD-10-CM | POA: Diagnosis present

## 2016-03-01 DIAGNOSIS — Z79899 Other long term (current) drug therapy: Secondary | ICD-10-CM | POA: Diagnosis not present

## 2016-03-01 DIAGNOSIS — G8929 Other chronic pain: Secondary | ICD-10-CM | POA: Diagnosis present

## 2016-03-01 DIAGNOSIS — E119 Type 2 diabetes mellitus without complications: Secondary | ICD-10-CM | POA: Diagnosis present

## 2016-03-01 DIAGNOSIS — H9190 Unspecified hearing loss, unspecified ear: Secondary | ICD-10-CM | POA: Diagnosis present

## 2016-03-01 DIAGNOSIS — Z01812 Encounter for preprocedural laboratory examination: Secondary | ICD-10-CM | POA: Diagnosis not present

## 2016-03-01 DIAGNOSIS — Z96641 Presence of right artificial hip joint: Secondary | ICD-10-CM | POA: Diagnosis present

## 2016-03-01 DIAGNOSIS — Z87891 Personal history of nicotine dependence: Secondary | ICD-10-CM | POA: Diagnosis not present

## 2016-03-01 DIAGNOSIS — M1612 Unilateral primary osteoarthritis, left hip: Principal | ICD-10-CM | POA: Diagnosis present

## 2016-03-01 DIAGNOSIS — Z6841 Body Mass Index (BMI) 40.0 and over, adult: Secondary | ICD-10-CM | POA: Diagnosis not present

## 2016-03-01 DIAGNOSIS — M169 Osteoarthritis of hip, unspecified: Secondary | ICD-10-CM | POA: Diagnosis present

## 2016-03-01 DIAGNOSIS — I2510 Atherosclerotic heart disease of native coronary artery without angina pectoris: Secondary | ICD-10-CM | POA: Diagnosis present

## 2016-03-01 DIAGNOSIS — Z8261 Family history of arthritis: Secondary | ICD-10-CM | POA: Diagnosis not present

## 2016-03-01 DIAGNOSIS — Z96649 Presence of unspecified artificial hip joint: Secondary | ICD-10-CM

## 2016-03-01 HISTORY — PX: TOTAL HIP ARTHROPLASTY: SHX124

## 2016-03-01 LAB — TYPE AND SCREEN
ABO/RH(D): A POS
Antibody Screen: NEGATIVE

## 2016-03-01 LAB — GLUCOSE, CAPILLARY
GLUCOSE-CAPILLARY: 116 mg/dL — AB (ref 65–99)
GLUCOSE-CAPILLARY: 200 mg/dL — AB (ref 65–99)
Glucose-Capillary: 159 mg/dL — ABNORMAL HIGH (ref 65–99)
Glucose-Capillary: 140 mg/dL — ABNORMAL HIGH (ref 65–99)

## 2016-03-01 SURGERY — ARTHROPLASTY, HIP, TOTAL, ANTERIOR APPROACH
Anesthesia: Spinal | Site: Hip | Laterality: Left

## 2016-03-01 MED ORDER — METOCLOPRAMIDE HCL 10 MG PO TABS: 5.0000 mg | ORAL_TABLET | Freq: Three times a day (TID) | ORAL | Status: DC | PRN

## 2016-03-01 MED ORDER — DIPHENHYDRAMINE HCL 12.5 MG/5ML PO ELIX: 12.5000 mg | ORAL_SOLUTION | ORAL | Status: DC | PRN

## 2016-03-01 MED ORDER — PHENOL 1.4 % MT LIQD: 1.0000 | OROMUCOSAL | Status: DC | PRN

## 2016-03-01 MED ORDER — HYDROMORPHONE HCL 1 MG/ML IJ SOLN: 0.2500 mg | INTRAMUSCULAR | Status: DC | PRN

## 2016-03-01 MED ORDER — SODIUM CHLORIDE 0.9 % IV SOLN
1000.0000 mg | Freq: Once | INTRAVENOUS | Status: AC
  Administered 2016-03-01: 1000 mg via INTRAVENOUS
  Filled 2016-03-01: qty 10

## 2016-03-01 MED ORDER — PHENYLEPHRINE HCL 10 MG/ML IJ SOLN
10.0000 mg | INTRAMUSCULAR | Status: DC | PRN
  Administered 2016-03-01: 80 ug/min via INTRAVENOUS

## 2016-03-01 MED ORDER — MIDAZOLAM HCL 2 MG/2ML IJ SOLN
INTRAMUSCULAR | Status: AC
  Filled 2016-03-01: qty 2

## 2016-03-01 MED ORDER — FENTANYL CITRATE (PF) 100 MCG/2ML IJ SOLN
INTRAMUSCULAR | Status: DC | PRN
  Administered 2016-03-01: 50 ug via INTRAVENOUS
  Administered 2016-03-01: 100 ug via INTRAVENOUS
  Administered 2016-03-01: 50 ug via INTRAVENOUS

## 2016-03-01 MED ORDER — PROPOFOL 10 MG/ML IV BOLUS
INTRAVENOUS | Status: DC | PRN
  Administered 2016-03-01: 200 mg via INTRAVENOUS

## 2016-03-01 MED ORDER — GLIPIZIDE ER 5 MG PO TB24
5.0000 mg | ORAL_TABLET | Freq: Every day | ORAL | Status: DC
  Administered 2016-03-02 – 2016-03-03 (×2): 5 mg via ORAL
  Filled 2016-03-01 (×3): qty 1

## 2016-03-01 MED ORDER — RIVAROXABAN 10 MG PO TABS
10.0000 mg | ORAL_TABLET | Freq: Every day | ORAL | Status: DC
  Administered 2016-03-02 – 2016-03-03 (×2): 10 mg via ORAL
  Filled 2016-03-01 (×3): qty 1

## 2016-03-01 MED ORDER — DEXAMETHASONE SODIUM PHOSPHATE 10 MG/ML IJ SOLN
INTRAMUSCULAR | Status: AC
  Filled 2016-03-01: qty 1

## 2016-03-01 MED ORDER — BISACODYL 10 MG RE SUPP
10.0000 mg | Freq: Every day | RECTAL | Status: DC | PRN
  Administered 2016-03-03: 10 mg via RECTAL
  Filled 2016-03-01 (×2): qty 1

## 2016-03-01 MED ORDER — ACETAMINOPHEN 500 MG PO TABS
1000.0000 mg | ORAL_TABLET | Freq: Four times a day (QID) | ORAL | Status: AC
  Administered 2016-03-01 – 2016-03-02 (×4): 1000 mg via ORAL
  Filled 2016-03-01 (×4): qty 2

## 2016-03-01 MED ORDER — OXYCODONE HCL 5 MG PO TABS
5.0000 mg | ORAL_TABLET | ORAL | Status: DC | PRN
  Administered 2016-03-01: 10 mg via ORAL
  Administered 2016-03-01: 5 mg via ORAL
  Administered 2016-03-01: 10 mg via ORAL
  Administered 2016-03-01: 5 mg via ORAL
  Administered 2016-03-02 (×4): 10 mg via ORAL
  Filled 2016-03-01 (×3): qty 2
  Filled 2016-03-01: qty 1
  Filled 2016-03-01 (×2): qty 2
  Filled 2016-03-01: qty 1
  Filled 2016-03-01: qty 2

## 2016-03-01 MED ORDER — ATORVASTATIN CALCIUM 80 MG PO TABS
80.0000 mg | ORAL_TABLET | Freq: Every day | ORAL | Status: DC
  Administered 2016-03-02 – 2016-03-03 (×2): 80 mg via ORAL
  Filled 2016-03-01 (×2): qty 1

## 2016-03-01 MED ORDER — POLYETHYLENE GLYCOL 3350 17 G PO PACK
17.0000 g | PACK | Freq: Every day | ORAL | Status: DC | PRN
  Administered 2016-03-03: 17 g via ORAL
  Filled 2016-03-01: qty 1

## 2016-03-01 MED ORDER — CEFAZOLIN SODIUM-DEXTROSE 2-3 GM-% IV SOLR
INTRAVENOUS | Status: AC
  Filled 2016-03-01: qty 50

## 2016-03-01 MED ORDER — DEXAMETHASONE SODIUM PHOSPHATE 10 MG/ML IJ SOLN
10.0000 mg | Freq: Once | INTRAMUSCULAR | Status: AC
  Administered 2016-03-01: 10 mg via INTRAVENOUS

## 2016-03-01 MED ORDER — SUCCINYLCHOLINE CHLORIDE 20 MG/ML IJ SOLN
INTRAMUSCULAR | Status: DC | PRN
  Administered 2016-03-01: 100 mg via INTRAVENOUS

## 2016-03-01 MED ORDER — LACTATED RINGERS IV SOLN: INTRAVENOUS | Status: DC

## 2016-03-01 MED ORDER — TRANEXAMIC ACID 1000 MG/10ML IV SOLN
1000.0000 mg | INTRAVENOUS | Status: AC
  Administered 2016-03-01: 1000 mg via INTRAVENOUS
  Filled 2016-03-01: qty 10

## 2016-03-01 MED ORDER — CEFAZOLIN SODIUM-DEXTROSE 2-3 GM-% IV SOLR
2.0000 g | INTRAVENOUS | Status: AC
  Administered 2016-03-01: 2 g via INTRAVENOUS

## 2016-03-01 MED ORDER — ONDANSETRON HCL 4 MG/2ML IJ SOLN
INTRAMUSCULAR | Status: DC | PRN
  Administered 2016-03-01: 4 mg via INTRAVENOUS

## 2016-03-01 MED ORDER — ROCURONIUM BROMIDE 100 MG/10ML IV SOLN
INTRAVENOUS | Status: DC | PRN
  Administered 2016-03-01: 50 mg via INTRAVENOUS

## 2016-03-01 MED ORDER — PHENYLEPHRINE HCL 10 MG/ML IJ SOLN
INTRAMUSCULAR | Status: DC | PRN
  Administered 2016-03-01: 80 ug via INTRAVENOUS
  Administered 2016-03-01: 120 ug via INTRAVENOUS

## 2016-03-01 MED ORDER — HYDROMORPHONE HCL 1 MG/ML IJ SOLN
INTRAMUSCULAR | Status: DC | PRN
  Administered 2016-03-01 (×2): 1 mg via INTRAVENOUS

## 2016-03-01 MED ORDER — MORPHINE SULFATE (PF) 2 MG/ML IV SOLN: 1.0000 mg | INTRAVENOUS | Status: DC | PRN

## 2016-03-01 MED ORDER — ONDANSETRON HCL 4 MG/2ML IJ SOLN
INTRAMUSCULAR | Status: AC
  Filled 2016-03-01: qty 2

## 2016-03-01 MED ORDER — BUPIVACAINE HCL (PF) 0.25 % IJ SOLN
INTRAMUSCULAR | Status: AC
  Filled 2016-03-01: qty 30

## 2016-03-01 MED ORDER — DEXAMETHASONE SODIUM PHOSPHATE 10 MG/ML IJ SOLN
10.0000 mg | Freq: Once | INTRAMUSCULAR | Status: AC
  Administered 2016-03-02: 10 mg via INTRAVENOUS
  Filled 2016-03-01: qty 1

## 2016-03-01 MED ORDER — FENTANYL CITRATE (PF) 100 MCG/2ML IJ SOLN
INTRAMUSCULAR | Status: AC
  Filled 2016-03-01: qty 2

## 2016-03-01 MED ORDER — METOCLOPRAMIDE HCL 5 MG/ML IJ SOLN: 5.0000 mg | Freq: Three times a day (TID) | INTRAMUSCULAR | Status: DC | PRN

## 2016-03-01 MED ORDER — ACETAMINOPHEN 325 MG PO TABS: 650.0000 mg | ORAL_TABLET | Freq: Four times a day (QID) | ORAL | Status: DC | PRN

## 2016-03-01 MED ORDER — MEPERIDINE HCL 50 MG/ML IJ SOLN: 6.2500 mg | INTRAMUSCULAR | Status: DC | PRN

## 2016-03-01 MED ORDER — SODIUM CHLORIDE 0.9 % IV SOLN: INTRAVENOUS | Status: DC

## 2016-03-01 MED ORDER — FLEET ENEMA 7-19 GM/118ML RE ENEM: 1.0000 | ENEMA | Freq: Once | RECTAL | Status: DC | PRN

## 2016-03-01 MED ORDER — PROMETHAZINE HCL 25 MG/ML IJ SOLN: 6.2500 mg | INTRAMUSCULAR | Status: DC | PRN

## 2016-03-01 MED ORDER — SUGAMMADEX SODIUM 200 MG/2ML IV SOLN
INTRAVENOUS | Status: DC | PRN
  Administered 2016-03-01: 200 mg via INTRAVENOUS

## 2016-03-01 MED ORDER — LIDOCAINE HCL (CARDIAC) 20 MG/ML IV SOLN
INTRAVENOUS | Status: AC
  Filled 2016-03-01: qty 5

## 2016-03-01 MED ORDER — TRAMADOL HCL 50 MG PO TABS: 50.0000 mg | ORAL_TABLET | Freq: Four times a day (QID) | ORAL | Status: DC | PRN

## 2016-03-01 MED ORDER — LACTATED RINGERS IV SOLN
INTRAVENOUS | Status: DC | PRN
  Administered 2016-03-01 (×2): via INTRAVENOUS

## 2016-03-01 MED ORDER — INSULIN ASPART 100 UNIT/ML ~~LOC~~ SOLN: 0.0000 [IU] | Freq: Three times a day (TID) | SUBCUTANEOUS | Status: DC

## 2016-03-01 MED ORDER — PROPOFOL 10 MG/ML IV BOLUS
INTRAVENOUS | Status: AC
  Filled 2016-03-01: qty 60

## 2016-03-01 MED ORDER — MIDAZOLAM HCL 5 MG/5ML IJ SOLN
INTRAMUSCULAR | Status: DC | PRN
  Administered 2016-03-01: 2 mg via INTRAVENOUS

## 2016-03-01 MED ORDER — HYDROMORPHONE HCL 1 MG/ML IJ SOLN
INTRAMUSCULAR | Status: AC
  Filled 2016-03-01: qty 1

## 2016-03-01 MED ORDER — ACETAMINOPHEN 650 MG RE SUPP: 650.0000 mg | Freq: Four times a day (QID) | RECTAL | Status: DC | PRN

## 2016-03-01 MED ORDER — ACETAMINOPHEN 10 MG/ML IV SOLN
INTRAVENOUS | Status: AC
  Filled 2016-03-01: qty 100

## 2016-03-01 MED ORDER — MENTHOL 3 MG MT LOZG: 1.0000 | LOZENGE | OROMUCOSAL | Status: DC | PRN

## 2016-03-01 MED ORDER — ONDANSETRON HCL 4 MG/2ML IJ SOLN
4.0000 mg | Freq: Four times a day (QID) | INTRAMUSCULAR | Status: DC | PRN
  Administered 2016-03-02: 4 mg via INTRAVENOUS
  Filled 2016-03-01: qty 2

## 2016-03-01 MED ORDER — CEFAZOLIN SODIUM-DEXTROSE 2-3 GM-% IV SOLR
2.0000 g | Freq: Four times a day (QID) | INTRAVENOUS | Status: AC
  Administered 2016-03-01 – 2016-03-02 (×2): 2 g via INTRAVENOUS
  Filled 2016-03-01 (×2): qty 50

## 2016-03-01 MED ORDER — ACETAMINOPHEN 10 MG/ML IV SOLN
1000.0000 mg | Freq: Once | INTRAVENOUS | Status: AC
  Administered 2016-03-01: 1000 mg via INTRAVENOUS

## 2016-03-01 MED ORDER — BUPIVACAINE HCL (PF) 0.25 % IJ SOLN
INTRAMUSCULAR | Status: DC | PRN
  Administered 2016-03-01: 30 mL

## 2016-03-01 MED ORDER — DEXTROSE 5 % IV SOLN
500.0000 mg | Freq: Four times a day (QID) | INTRAVENOUS | Status: DC | PRN
  Administered 2016-03-01: 500 mg via INTRAVENOUS
  Filled 2016-03-01 (×2): qty 5

## 2016-03-01 MED ORDER — ROCURONIUM BROMIDE 100 MG/10ML IV SOLN
INTRAVENOUS | Status: AC
  Filled 2016-03-01: qty 1

## 2016-03-01 MED ORDER — ONDANSETRON HCL 4 MG PO TABS: 4.0000 mg | ORAL_TABLET | Freq: Four times a day (QID) | ORAL | Status: DC | PRN

## 2016-03-01 MED ORDER — 0.9 % SODIUM CHLORIDE (POUR BTL) OPTIME
TOPICAL | Status: DC | PRN
  Administered 2016-03-01: 1000 mL

## 2016-03-01 MED ORDER — HYDROMORPHONE HCL 2 MG/ML IJ SOLN
INTRAMUSCULAR | Status: AC
  Filled 2016-03-01: qty 1

## 2016-03-01 MED ORDER — LIDOCAINE HCL (CARDIAC) 20 MG/ML IV SOLN
INTRAVENOUS | Status: DC | PRN
  Administered 2016-03-01: 75 mg via INTRAVENOUS

## 2016-03-01 MED ORDER — DOCUSATE SODIUM 100 MG PO CAPS
100.0000 mg | ORAL_CAPSULE | Freq: Two times a day (BID) | ORAL | Status: DC
  Administered 2016-03-01 – 2016-03-03 (×4): 100 mg via ORAL

## 2016-03-01 MED ORDER — METHOCARBAMOL 500 MG PO TABS
500.0000 mg | ORAL_TABLET | Freq: Four times a day (QID) | ORAL | Status: DC | PRN
  Administered 2016-03-02 (×2): 500 mg via ORAL
  Filled 2016-03-01 (×2): qty 1

## 2016-03-01 SURGICAL SUPPLY — 36 items
BAG DECANTER FOR FLEXI CONT (MISCELLANEOUS) ×3 IMPLANT
BAG SPEC THK2 15X12 ZIP CLS (MISCELLANEOUS)
BAG ZIPLOCK 12X15 (MISCELLANEOUS) IMPLANT
BLADE SAG 18X100X1.27 (BLADE) ×3 IMPLANT
CAPT HIP TOTAL 2 ×3 IMPLANT
CLOSURE WOUND 1/2 X4 (GAUZE/BANDAGES/DRESSINGS) ×1
CLOTH BEACON ORANGE TIMEOUT ST (SAFETY) ×3 IMPLANT
COVER PERINEAL POST (MISCELLANEOUS) ×3 IMPLANT
DECANTER SPIKE VIAL GLASS SM (MISCELLANEOUS) ×3 IMPLANT
DRAPE STERI IOBAN 125X83 (DRAPES) ×3 IMPLANT
DRAPE U-SHAPE 47X51 STRL (DRAPES) ×6 IMPLANT
DRSG ADAPTIC 3X8 NADH LF (GAUZE/BANDAGES/DRESSINGS) ×3 IMPLANT
DRSG MEPILEX BORDER 4X4 (GAUZE/BANDAGES/DRESSINGS) ×3 IMPLANT
DRSG MEPILEX BORDER 4X8 (GAUZE/BANDAGES/DRESSINGS) ×3 IMPLANT
DURAPREP 26ML APPLICATOR (WOUND CARE) ×3 IMPLANT
ELECT REM PT RETURN 9FT ADLT (ELECTROSURGICAL) ×3
ELECTRODE REM PT RTRN 9FT ADLT (ELECTROSURGICAL) ×1 IMPLANT
EVACUATOR 1/8 PVC DRAIN (DRAIN) ×3 IMPLANT
GLOVE BIO SURGEON STRL SZ7.5 (GLOVE) ×3 IMPLANT
GLOVE BIO SURGEON STRL SZ8 (GLOVE) ×6 IMPLANT
GLOVE BIOGEL PI IND STRL 8 (GLOVE) ×2 IMPLANT
GLOVE BIOGEL PI INDICATOR 8 (GLOVE) ×4
GOWN STRL REUS W/TWL LRG LVL3 (GOWN DISPOSABLE) ×3 IMPLANT
GOWN STRL REUS W/TWL XL LVL3 (GOWN DISPOSABLE) ×3 IMPLANT
PACK ANTERIOR HIP CUSTOM (KITS) ×3 IMPLANT
STRIP CLOSURE SKIN 1/2X4 (GAUZE/BANDAGES/DRESSINGS) ×2 IMPLANT
SUT ETHIBOND NAB CT1 #1 30IN (SUTURE) ×3 IMPLANT
SUT MNCRL AB 4-0 PS2 18 (SUTURE) ×6 IMPLANT
SUT VIC AB 2-0 CT1 27 (SUTURE) ×6
SUT VIC AB 2-0 CT1 TAPERPNT 27 (SUTURE) ×2 IMPLANT
SUT VLOC 180 0 24IN GS25 (SUTURE) ×6 IMPLANT
SYR 50ML LL SCALE MARK (SYRINGE) IMPLANT
TRAY FOLEY W/METER SILVER 14FR (SET/KITS/TRAYS/PACK) ×3 IMPLANT
TRAY FOLEY W/METER SILVER 16FR (SET/KITS/TRAYS/PACK) IMPLANT
WATER STERILE IRR 1000ML POUR (IV SOLUTION) ×6 IMPLANT
YANKAUER SUCT BULB TIP 10FT TU (MISCELLANEOUS) ×3 IMPLANT

## 2016-03-01 NOTE — Anesthesia Preprocedure Evaluation (Signed)
Anesthesia Evaluation  Patient identified by MRN, date of birth, ID band Patient awake    Reviewed: Allergy & Precautions, NPO status , Patient's Chart, lab work & pertinent test results  Airway Mallampati: II  TM Distance: >3 FB Neck ROM: Full    Dental no notable dental hx.    Pulmonary former smoker,    Pulmonary exam normal breath sounds clear to auscultation       Cardiovascular + CAD  Normal cardiovascular exam Rhythm:Regular Rate:Normal     Neuro/Psych negative neurological ROS  negative psych ROS   GI/Hepatic Neg liver ROS, PUD, GERD  ,  Endo/Other  diabetes, Type 2, Oral Hypoglycemic Agents  Renal/GU negative Renal ROS     Musculoskeletal  (+) Arthritis ,   Abdominal   Peds  Hematology negative hematology ROS (+)   Anesthesia Other Findings   Reproductive/Obstetrics negative OB ROS                             Anesthesia Physical Anesthesia Plan  ASA: III  Anesthesia Plan: Spinal   Post-op Pain Management:    Induction:   Airway Management Planned:   Additional Equipment:   Intra-op Plan:   Post-operative Plan:   Informed Consent: I have reviewed the patients History and Physical, chart, labs and discussed the procedure including the risks, benefits and alternatives for the proposed anesthesia with the patient or authorized representative who has indicated his/her understanding and acceptance.   Dental advisory given  Plan Discussed with: CRNA  Anesthesia Plan Comments:         Anesthesia Quick Evaluation

## 2016-03-01 NOTE — Anesthesia Procedure Notes (Signed)
Procedure Name: Intubation Date/Time: 03/01/2016 11:27 AM Performed by: Elyn PeersALLEN, Denai Caba J Pre-anesthesia Checklist: Patient identified, Emergency Drugs available, Suction available, Patient being monitored and Timeout performed Patient Re-evaluated:Patient Re-evaluated prior to inductionOxygen Delivery Method: Circle system utilized Preoxygenation: Pre-oxygenation with 100% oxygen Intubation Type: IV induction Ventilation: Mask ventilation without difficulty Laryngoscope Size: Glidescope and 4 Grade View: Grade I Tube type: Parker flex tip Tube size: 7.5 mm Number of attempts: 1 Airway Equipment and Method: Stylet Placement Confirmation: ETT inserted through vocal cords under direct vision,  positive ETCO2 and breath sounds checked- equal and bilateral Secured at: 22 cm Tube secured with: Tape Dental Injury: Teeth and Oropharynx as per pre-operative assessment  Difficulty Due To: Difficult Airway- due to large tongue and Difficulty was anticipated

## 2016-03-01 NOTE — Anesthesia Postprocedure Evaluation (Signed)
Anesthesia Post Note  Patient: Sylvia [name]  Procedure(s) Performed: Procedure(s) (LRB): LEFT TOTAL HIP ARTHROPLASTY ANTERIOR APPROACH (Left)  Patient location during evaluation: PACU Anesthesia Type: General Level of consciousness: sedated and patient cooperative Pain management: pain level controlled Vital Signs Assessment: post-procedure vital signs reviewed and stable Respiratory status: spontaneous breathing Cardiovascular status: stable Anesthetic complications: no    Last Vitals:  Filed Vitals:   03/01/16 1534 03/01/16 1634  BP: 144/75 143/79  Pulse: 96 94  Temp: 36.7 C 36.7 C  Resp: 15 16    Last Pain:  Filed Vitals:   03/01/16 1638  PainSc: 2                  [name]

## 2016-03-01 NOTE — H&P (View-Only) (Signed)
Sylvia [name] DOB: 03/22/1966 Married / Language: English / Race: White Female Date of Admission:  03/01/2016 CC:  Left Hip Pain History of Present Illness  The patient is a 50 year old female who comes in today for a preoperative History and Physical. The patient is scheduled for a left total hip arthroplasty (anterior) to be performed by Dr. [name], MD at [facility] on 03-01-2016. The patient is a 50 year old female who presented with a hip problem. The patient was seen referral from Dr. David [name].The patient reports left lateral hip problems including pain symptoms that have been present for 2 year(s). The symptoms began without any known injury. Symptoms reported include hip pain, weakness, stiffness, catching, difficulty flexing hip and difficulty ambulating The patient reports symptoms radiating to the: left groin and left knee (and occasionally to her foot).The patient feels as if their symptoms are does feel they are worsening. Symptoms are exacerbated by flexing hip. Prior to being seen, the patient was previously evaluated by a primary physician. Previous treatment for this problem has included nonsteroidal anti-inflammatory drugs (Voltaren 75mg) and opioid analgesics (hydrocodone; along with Tizanidine as needed. She reports she is out of the pain medication). She had her other hip replaced by Dr. [name] in [place]. She states she did well with that. She would prefer to have any further surgery down here in [place]. The left hip is bothering her at all times now. Says it is as bad as the right one was prior to when she had that replaced. She is hurting at night. She cannot get around and do what she desires. She would like to exercise and lose weight, but cannot do so because of this left hip. She has got advanced end stage arthritis of the left hip with progressively worsening pain and dysfunction. At this point, the most predictable means of improving her  pain and function would be total hip arthroplasty. They have been treated conservatively in the past for the above stated problem and despite conservative measures, they continue to have progressive pain and severe functional limitations and dysfunction. They have failed non-operative management including home exercise, medications. It is felt that they would benefit from undergoing total joint replacement. Risks and benefits of the procedure have been discussed with the patient and they elect to proceed with surgery. There are no active contraindications to surgery such as ongoing infection or rapidly progressive neurological disease.  Problem List/Past Medical  Pain of left hip joint (M25.552)  Primary osteoarthritis of left hip (M16.12)  Chronic Pain  Diabetes Mellitus, Type II  Hypercholesterolemia  Osteoarthritis  Osteoporosis  Vertigo  Impaired Hearing  Hypercholesterolemia  Gastric Ulcer  Gastroesophageal Reflux Disease  Degenerative Disc Disease  Measles  Mumps  Allergies No Known Drug Allergies  Intolerances Demerol *ANALGESICS - OPIOID*  Vomiting. Contrast Media Ready-Box *MEDICAL DEVICES AND SUPPLIES*  Convulsions  Family History Cancer  Maternal Grandmother. Drug / Alcohol Addiction  Brother, Father, Maternal Grandmother, Sister. Osteoarthritis  Father. Rheumatoid Arthritis  Father.  Social History  Children  2 Current work status  disabled Exercise  Exercises daily; does running / walking Former drinker  10/28/2015: In the past drank wine only occasionally per week Living situation  live with spouse Marital status  married No history of drug/alcohol rehab  Not under pain contract  Number of flights of stairs before winded  2-3 Tobacco use  Former smoker. 10/28/2015: smoke(d) less than 1/2 pack(s) per day Post-Surgical Plans    Home  Medication History Diclofenac (75MG Tablet, Oral) Active. GlipiZIDE XL (5MG Tablet ER 24HR,  Oral) Active. Calcium + D (500-1000-40MG-UNT-MCG Tablet Chewable, Oral) Active. Atorvastatin Calcium (80MG Tablet, Oral daily) Active.  Past Surgical History Hysterectomy  partial (non-cancerous) Spinal Surgery  Disc Surgery Total Hip Replacement  right Tubal Ligation  Fibroid Surgery     Review of Systems General Not Present- Chills, Fatigue, Fever, Memory Loss, Night Sweats, Weight Gain and Weight Loss. Skin Not Present- Eczema, Hives, Itching, Lesions and Rash. HEENT Not Present- Dentures, Double Vision, Headache, Hearing Loss, Tinnitus and Visual Loss. Respiratory Not Present- Allergies, Chronic Cough, Coughing up blood, Shortness of breath at rest and Shortness of breath with exertion. Cardiovascular Not Present- Chest Pain, Difficulty Breathing Lying Down, Murmur, Palpitations, Racing/skipping heartbeats and Swelling. Gastrointestinal Not Present- Abdominal Pain, Bloody Stool, Constipation, Diarrhea, Difficulty Swallowing, Heartburn, Jaundice, Loss of appetitie, Nausea and Vomiting. Female Genitourinary Not Present- Blood in Urine, Discharge, Flank Pain, Incontinence, Painful Urination, Urgency, Urinary frequency, Urinary Retention, Urinating at Night and Weak urinary stream. Musculoskeletal Not Present- Back Pain, Joint Pain, Joint Swelling, Morning Stiffness, Muscle Pain, Muscle Weakness and Spasms. Neurological Not Present- Blackout spells, Difficulty with balance, Dizziness, Paralysis, Tremor and Weakness. Psychiatric Not Present- Insomnia.  Vitals  Height: 63in Pulse: 88 (Regular)  Resp.: 20 (Unlabored)  BP: 126/62 (Sitting, Left Arm, Standard)       Physical Exam General Mental Status -Alert, cooperative and good historian. General Appearance-pleasant, Not in acute distress. Orientation-Oriented X3. Build & Nutrition-Well nourished and Well developed.  Head and Neck Head-normocephalic, atraumatic . Neck Global Assessment - supple, no  bruit auscultated on the right, no bruit auscultated on the left.  Eye Vision-Wears corrective lenses. Pupil - Bilateral-Regular and Round. Motion - Bilateral-EOMI.  Chest and Lung Exam Auscultation Breath sounds - clear at anterior chest wall and clear at posterior chest wall. Adventitious sounds - No Adventitious sounds.  Cardiovascular Auscultation Rhythm - Regular rate and rhythm. Heart Sounds - S1 WNL and S2 WNL. Murmurs & Other Heart Sounds - Auscultation of the heart reveals - No Murmurs.  Abdomen Palpation/Percussion Tenderness - Abdomen is non-tender to palpation. Rigidity (guarding) - Abdomen is soft. Auscultation Auscultation of the abdomen reveals - Bowel sounds normal.  Female Genitourinary Note: Not done, not pertinent to present illness   Musculoskeletal Note: On exam, she is alert and oriented, in no apparent distress. Right hip can flexed to about 100, rotate in 20, out 30, abduct 30 without discomfort. Left hip flexion to 90, no internal or external rotation, about 20 degrees of abduction. Knee exam is unremarkable. She has intact pulses and sensation. Motor is intact. Gait pattern is significantly antalgic on the left.  RADIOGRAPHS AP of the pelvis and lateral of the left hip shows severe end stage arthritis, bone-on-bone near ankylosed joint. The right hip replacements in good position, no abnormalities.   Assessment & Plan  Primary osteoarthritis of left hip (M16.12)  Note:Surgical Plans: Left Total Hip Replacement - Anterior Approach  Disposition: Home  PCP: Dr. David [name]  IV TXA  Anesthesia Issues: None  Signed electronically by Alezandrew L Perkins, III PA-C 

## 2016-03-01 NOTE — Interval H&P Note (Signed)
History and Physical Interval Note:  03/01/2016 9:32 AM  Sylvia [name]  has presented today for surgery, with the diagnosis of OA LEFT HIP  The various methods of treatment have been discussed with the patient and family. After consideration of risks, benefits and other options for treatment, the patient has consented to  Procedure(s): LEFT TOTAL HIP ARTHROPLASTY ANTERIOR APPROACH (Left) as a surgical intervention .  The patient's history has been reviewed, patient examined, no change in status, stable for surgery.  I have reviewed the patient's chart and labs.  Questions were answered to the patient's satisfaction.     Loanne DrillingALUISIO,Dynver Clemson V

## 2016-03-01 NOTE — Transfer of Care (Signed)
Immediate Anesthesia Transfer of Care Note  Patient: Sylvia NearingLisa A [name]  Procedure(s) Performed: Procedure(s): LEFT TOTAL HIP ARTHROPLASTY ANTERIOR APPROACH (Left)  Patient Location: PACU  Anesthesia Type:General  Level of Consciousness:  sedated, patient cooperative and responds to stimulation  Airway & Oxygen Therapy:Patient Spontanous Breathing and Patient connected to face mask oxgen  Post-op Assessment:  Report given to PACU RN and Post -op Vital signs reviewed and stable  Post vital signs:  Reviewed and stable  Last Vitals:  Filed Vitals:   03/01/16 0841 03/01/16 1321  BP: 128/91   Pulse: 82   Temp: 36.5 C 36.7 C  Resp: 18     Complications: No apparent anesthesia complications

## 2016-03-01 NOTE — Op Note (Signed)
OPERATIVE REPORT  PREOPERATIVE DIAGNOSIS: Osteoarthritis of the Left hip.   POSTOPERATIVE DIAGNOSIS: Osteoarthritis of the Left  hip.   PROCEDURE: Left total hip arthroplasty, anterior approach.   SURGEON: Ollen GrossFrank Taleshia Luff, MD   ASSISTANT: Avel Peacerew Perkins, PA-C  ANESTHESIA:  General  ESTIMATED BLOOD LOSS:-500 ml   DRAINS: Hemovac x1.   COMPLICATIONS: None   CONDITION: PACU - hemodynamically stable.   BRIEF CLINICAL NOTE: Sylvia [name] is a 50 y.o. female who has advanced end-  stage arthritis of her Left  hip with progressively worsening pain and  dysfunction.The patient has failed nonoperative management and presents for  total hip arthroplasty.   PROCEDURE IN DETAIL: After successful administration of spinal  anesthetic, the traction boots for the [facility] bed were placed on both  feet and the patient was [facility] bed, boots placed into the leg  holders. The Left hip was then isolated from the perineum with plastic  drapes and prepped and draped in the usual sterile fashion. ASIS and  greater trochanter were marked and a oblique incision was made, starting  at about 1 cm lateral and 2 cm distal to the ASIS and coursing towards  the anterior cortex of the femur. The skin was cut with a 10 blade  through subcutaneous tissue to the level of the fascia overlying the  tensor fascia lata muscle. The fascia was then incised in line with the  incision at the junction of the anterior third and posterior 2/3rd. The  muscle was teased off the fascia and then the interval between the TFL  and the rectus was developed. The Hohmann retractor was then placed at  the top of the femoral neck over the capsule. The vessels overlying the  capsule were cauterized and the fat on top of the capsule was removed.  A Hohmann retractor was then placed anterior underneath the rectus  femoris to give exposure to the entire anterior capsule. A T-shaped  capsulotomy was performed. The  edges were tagged and the femoral head  was identified.       Osteophytes are removed off the superior acetabulum.  The femoral neck was then cut in situ with an oscillating saw. Traction  was then applied to the left lower extremity utilizing the [facility]  traction. The femoral head was then removed. Retractors were placed  around the acetabulum and then circumferential removal of the labrum was  performed. Osteophytes were also removed. Reaming starts at 45 mm to  medialize and  Increased in 2 mm increments to 49 mm. We reamed in  approximately 40 degrees of abduction, 20 degrees anteversion. A 50 mm  pinnacle acetabular shell was then impacted in anatomic position under  fluoroscopic guidance with excellent purchase. We did not need to place  any additional dome screws. A 32 mm neutral + 4 marathon liner was then  placed into the acetabular shell.       The femoral lift was then placed along the lateral aspect of the femur  just distal to the vastus ridge. The leg was  externally rotated and capsule  was stripped off the inferior aspect of the femoral neck down to the  level of the lesser trochanter, this was done with electrocautery. The femur was lifted after this was performed. The  leg was then placed and extended in adducted position to essentially delivering the femur. We also removed the capsule superiorly and the  piriformis from the piriformis  fossa to gain excellent exposure of the  proximal femur. Rongeur was used to remove some cancellous bone to get  into the lateral portion of the proximal femur for placement of the  initial starter reamer. The starter broaches was placed  the starter broach  and was shown to go down the center of the canal. Broaching  with the  Corail system was then performed starting at size 8, coursing  Up to size 11. A size 11 had excellent torsional and rotational  and axial stability. The trial standard offset neck was then placed  with a 32 + 1 trial  head. The hip was then reduced. We confirmed that  the stem was in the canal both on AP and lateral x-rays. It also has excellent sizing. The hip was reduced with outstanding stability through full extension, full external rotation,  and then flexion in adduction internal rotation. AP pelvis was taken  and the leg lengths were measured and found to be exactly equal. Hip  was then dislocated again and the femoral head and neck removed. The  femoral broach was removed. Size 11 Corail stem with a standard offset  neck was then impacted into the femur following native anteversion. Has  excellent purchase in the canal. Excellent torsional and rotational and  axial stability. It is confirmed to be in the canal on AP and lateral  fluoroscopic views. The 32 + 1 ceramic head was placed and the hip  reduced with outstanding stability. Again AP pelvis was taken and it  confirmed that the leg lengths were equal. The wound was then copiously  irrigated with saline solution and the capsule reattached and repaired  with Ethibond suture. 30 ml of .25% Bupivicaine injected into the capsule and into the edge of the tensor fascia lata as well as subcutaneous tissue. The fascia overlying the tensor fascia lata was  then closed with a running #1 V-Loc. Subcu was closed with interrupted  2-0 Vicryl and subcuticular running 4-0 Monocryl. Incision was cleaned  and dried. Steri-Strips and a bulky sterile dressing applied. Hemovac  drain was hooked to suction and then he was awakened and transported to  recovery in stable condition.        Please note that a surgical assistant was a medical necessity for this procedure to perform it in a safe and expeditious manner. Assistant was necessary to provide appropriate retraction of vital neurovascular structures and to prevent femoral fracture and allow for anatomic placement of the prosthesis.  Gaynelle Arabian, M.D.

## 2016-03-01 NOTE — Progress Notes (Signed)
6th floor aware pt will be ion 1601 in 20 minutes.  Pt on bed and needs pump and pulsoximetry.

## 2016-03-02 LAB — BASIC METABOLIC PANEL
Anion gap: 8 (ref 5–15)
BUN: 11 mg/dL (ref 6–20)
CO2: 25 mmol/L (ref 22–32)
Calcium: 8.7 mg/dL — ABNORMAL LOW (ref 8.9–10.3)
Chloride: 104 mmol/L (ref 101–111)
Creatinine, Ser: 0.96 mg/dL (ref 0.44–1.00)
GFR calc Af Amer: >60 mL/min (ref >60)
GFR calc non Af Amer: >60 mL/min (ref >60)
GLUCOSE: 178 mg/dL — AB (ref 65–99)
POTASSIUM: 4.6 mmol/L (ref 3.5–5.1)
SODIUM: 137 mmol/L (ref 135–145)

## 2016-03-02 LAB — CBC
HCT: 32.3 % — ABNORMAL LOW (ref 36.0–46.0)
Hemoglobin: 10.2 g/dL — ABNORMAL LOW (ref 12.0–15.0)
MCH: 28.3 pg (ref 26.0–34.0)
MCHC: 31.6 g/dL (ref 30.0–36.0)
MCV: 89.7 fL (ref 78.0–100.0)
PLATELETS: 274 10*3/uL (ref 150–400)
RBC: 3.6 MIL/uL — AB (ref 3.87–5.11)
RDW: 14.2 % (ref 11.5–15.5)
WBC: 11.6 10*3/uL — AB (ref 4.0–10.5)

## 2016-03-02 LAB — GLUCOSE, CAPILLARY
Glucose-Capillary: 134 mg/dL — ABNORMAL HIGH (ref 65–99)
Glucose-Capillary: 161 mg/dL — ABNORMAL HIGH (ref 65–99)
Glucose-Capillary: 169 mg/dL — ABNORMAL HIGH (ref 65–99)
Glucose-Capillary: 163 mg/dL — ABNORMAL HIGH (ref 65–99)

## 2016-03-02 MED ORDER — RIVAROXABAN 10 MG PO TABS: 10.0000 mg | ORAL_TABLET | Freq: Every day | ORAL | Status: DC

## 2016-03-02 MED ORDER — TRAMADOL HCL 50 MG PO TABS: 50.0000 mg | ORAL_TABLET | Freq: Four times a day (QID) | ORAL | Status: DC | PRN

## 2016-03-02 MED ORDER — METHOCARBAMOL 500 MG PO TABS: 500.0000 mg | ORAL_TABLET | Freq: Four times a day (QID) | ORAL | Status: DC | PRN

## 2016-03-02 MED ORDER — OXYCODONE HCL 5 MG PO TABS
5.0000 mg | ORAL_TABLET | ORAL | Status: DC | PRN
Start: 2016-03-02 — End: 2018-02-16

## 2016-03-02 MED ORDER — OXYCODONE HCL 5 MG PO TABS
10.0000 mg | ORAL_TABLET | ORAL | Status: DC | PRN
  Administered 2016-03-02 – 2016-03-03 (×2): 15 mg via ORAL
  Administered 2016-03-03: 10 mg via ORAL
  Administered 2016-03-03: 15 mg via ORAL
  Filled 2016-03-02 (×4): qty 3

## 2016-03-02 NOTE — Progress Notes (Signed)
   Subjective: 1 Day Post-Op Procedure(s) (LRB): LEFT TOTAL HIP ARTHROPLASTY ANTERIOR APPROACH (Left) Patient reports pain as mild.   Patient seen in rounds with Dr. Lequita HaltAluisio. Patient is well, but has had some minor complaints of pain in the hip, requiring pain medications We will start therapy today.  If they do well with therapy and meets all goals, then will allow home later this afternoon following therapy. Plan is to go Home after hospital stay.  Objective: Vital signs in last 24 hours: Temp:  [97.7 F (36.5 C)-98.4 F (36.9 C)] 98.3 F (36.8 C) (03/16 0619) Pulse Rate:  [82-98] 85 (03/16 0619) Resp:  [13-18] 16 (03/16 0619) BP: (120-159)/(62-92) 125/65 mmHg (03/16 0619) SpO2:  [99 %-100 %] 100 % (03/16 0619) Weight:  [115.849 kg (255 lb 6.4 oz)] 115.849 kg (255 lb 6.4 oz) (03/15 0834)  Intake/Output from previous day:  Intake/Output Summary (Last 24 hours) at 03/02/16 0801 Last data filed at 03/02/16 [id number]  Gross per 24 hour  Intake 3118.92 ml  Output   3280 ml  Net -161.08 ml    Intake/Output this shift: UOP 1550 since around MN  Labs:  Recent Labs  03/02/16 0408  HGB 10.2*    Recent Labs  03/02/16 0408  WBC 11.6*  RBC 3.60*  HCT 32.3*  PLT 274    Recent Labs  03/02/16 0408  NA 137  K 4.6  CL 104  CO2 25  BUN 11  CREATININE 0.96  GLUCOSE 178*  CALCIUM 8.7*   No results for input(s): LABPT, INR in the last 72 hours.  EXAM General - Patient is Alert, Appropriate and Oriented Extremity - Neurovascular intact Sensation intact distally Dorsiflexion/Plantar flexion intact Dressing - dressing C/D/I Motor Function - intact, moving foot and toes well on exam.  Hemovac pulled without difficulty.  Past Medical History  Diagnosis Date  . Diabetes mellitus without complication (HCC)   . High cholesterol   . Coronary artery disease   . Chronic pain   . Arthritis     oa  . Osteoporosis   . Vertigo   . Impaired hearing     left ear  . Gastric  ulcer yrs ago  . DJD (degenerative joint disease)   . Measles as child  . Mumps as child  . GERD (gastroesophageal reflux disease)     hx of, none recent    Assessment/Plan: 1 Day Post-Op Procedure(s) (LRB): LEFT TOTAL HIP ARTHROPLASTY ANTERIOR APPROACH (Left) Active Problems:   OA (osteoarthritis) of hip  Estimated body mass index is 44.53 kg/(m^2) as calculated from the following:   Height as of this encounter: 5' 3.5" (1.613 m).   Weight as of this encounter: 115.849 kg (255 lb 6.4 oz). Advance diet Up with therapy Discharge home with home health  DVT Prophylaxis - Xarelto Weight Bearing As Tolerated left Leg Hemovac Pulled Begin Therapy  If meets goals and able to go home: Up with therapy Discharge home with home health if meets goals and doing well Diet - Diabetic diet and Cardiac Diet Follow up - in 2 weeks Activity - WBAT Disposition - Home Condition Upon Discharge - Pending therapy results D/C Meds - See DC Summary DVT Prophylaxis - Xarelto  Avel Peacerew Lauris Keepers, PA-C Orthopaedic Surgery 03/02/2016, 8:01 AM

## 2016-03-02 NOTE — Progress Notes (Signed)
Physical Therapy Treatment Patient Details Name: Boykin NearingLisa A Cabell MRN: [id number] DOB: 11/29/1966 Today's Date: 03/02/2016    History of Present Illness s/p L DA THA    PT Comments    Steady progress with mobility - pt ltd by fatigue, pain and c/o leg length discrepancy.  Follow Up Recommendations  Home health PT     Equipment Recommendations  Rolling walker with 5" wheels    Recommendations for Other Services OT consult     Precautions / Restrictions Precautions Precautions: Fall Restrictions Weight Bearing Restrictions: No Other Position/Activity Restrictions: WBAT    Mobility  Bed Mobility Overal bed mobility: Needs Assistance Bed Mobility: Supine to Sit;Sit to Supine     Supine to sit: Min assist Sit to supine: Min assist   General bed mobility comments: cues for sequence and use of R LE to self assist.  Pt using rail to self assist  Transfers Overall transfer level: Needs assistance Equipment used: Rolling walker (2 wheeled) Transfers: Sit to/from Stand Sit to Stand: Min assist         General transfer comment: steadying assistance; assist to extend LLE when sitting and cues for UE/LE placement  Ambulation/Gait Ambulation/Gait assistance: Min assist Ambulation Distance (Feet): 140 Feet Assistive device: Rolling walker (2 wheeled) Gait Pattern/deviations: Step-to pattern;Step-through pattern;Decreased step length - right;Decreased step length - left;Shuffle;Trunk flexed Gait velocity: decr Gait velocity interpretation: Below normal speed for age/gender General Gait Details: cues for posture, position from RW and initial sequence   Stairs            Wheelchair Mobility    Modified Rankin (Stroke Patients Only)       Balance                                    Cognition Arousal/Alertness: Awake/alert Behavior During Therapy: WFL for tasks assessed/performed Overall Cognitive Status: Within Functional Limits for tasks  assessed                      Exercises      General Comments        Pertinent Vitals/Pain Pain Assessment: 0-10 Pain Score: 5  Pain Location: L hip and knee Pain Descriptors / Indicators: Aching;Sore Pain Intervention(s): Limited activity within patient's tolerance;Monitored during session;Premedicated before session;Patient requesting pain meds-RN notified;Ice applied    Home Living                      Prior Function            PT Goals (current goals can now be found in the care plan section) Acute Rehab PT Goals Patient Stated Goal: decreased pain; back to being independent PT Goal Formulation: With patient Time For Goal Achievement: 03/06/16 Potential to Achieve Goals: Good Progress towards PT goals: Progressing toward goals    Frequency  7X/week    PT Plan Current plan remains appropriate    Co-evaluation             End of Session Equipment Utilized During Treatment: Gait belt Activity Tolerance: Patient tolerated treatment well Patient left: in bed;with call bell/phone within reach;with bed alarm set     Time: 1540-1617 PT Time Calculation (min) (ACUTE ONLY): 37 min  Charges:  $Gait Training: 23-37 mins                    G Codes:  Jagdeep Ancheta 03/02/2016, 5:18 PM

## 2016-03-02 NOTE — Care Management Note (Addendum)
Case Management Note  Patient Details  Name: Sylvia [name] MRN: [id number] Date of Birth: 02/11/1966  Subjective/Objective:                  LEFT TOTAL HIP ARTHROPLASTY ANTERIOR APPROACH (Left) Action/Plan: discharge planning Expected Discharge Date:                  Expected Discharge Plan:  Home w Home Health Services  In-House Referral:     Discharge planning Services  CM Consult  Post Acute Care Choice:  Home Health Choice offered to:  Patient  DME Arranged:  Walker rolling DME Agency:  [facility]  HH Arranged:  PT HH Agency:  [facility]  Status of Service:  Completed, signed off  Medicare Important Message Given:    Date Medicare IM Given:    Medicare IM give by:    Date Additional Medicare IM Given:    Additional Medicare Important Message give by:     If discussed at Long Length of Stay Meetings, dates discussed:    Additional Comments: Utilization Review complete. CM met with pt in room to offer choice of home health agency.  Pt chooses [facility] to render HHPT.  Referral given to [facility] rep, Tim (on unit). CM called [facility] DME rep, Stephanie to please deliver the rolling walker to room prior to discharge.  No other CM needs were communicated. Jeffries, Sarah Christine, RN 03/02/2016, 12:42 PM  

## 2016-03-02 NOTE — Evaluation (Signed)
Physical Therapy Evaluation Patient Details Name: Sylvia [name] MRN: [id number] DOB: 04/10/1966 Today's Date: 03/02/2016   History of Present Illness  s/p L DA THA  Clinical Impression  Pt s/p L THR presents with decreased L LE strength/ROM and post op pain limiting functional mobility.  Pt should progress to dc home with family assist and HHPT follow up.    Follow Up Recommendations Home health PT    Equipment Recommendations  Rolling walker with 5" wheels    Recommendations for Other Services OT consult     Precautions / Restrictions Precautions Precautions: Fall Restrictions Weight Bearing Restrictions: No Other Position/Activity Restrictions: WBAT      Mobility  Bed Mobility Overal bed mobility: Needs Assistance Bed Mobility: Supine to Sit     Supine to sit: Min assist     General bed mobility comments: cues for sequence and use of R LE to self assist.  Pt using rail to self assist  Transfers Overall transfer level: Needs assistance Equipment used: Rolling walker (2 wheeled) Transfers: Sit to/from Stand Sit to Stand: Min assist         General transfer comment: steadying assistance; assist to extend LLE when sitting and cues for UE/LE placement  Ambulation/Gait Ambulation/Gait assistance: Min assist;Min guard Ambulation Distance (Feet): 90 Feet Assistive device: Rolling walker (2 wheeled) Gait Pattern/deviations: Step-to pattern;Decreased step length - right;Decreased step length - left;Shuffle;Trunk flexed Gait velocity: decr Gait velocity interpretation: Below normal speed for age/gender General Gait Details: cues for posture, position from RW and sequence  Stairs            Wheelchair Mobility    Modified Rankin (Stroke Patients Only)       Balance                                             Pertinent Vitals/Pain Pain Assessment: 0-10 Pain Score: 3  Pain Location: L hip Pain Descriptors / Indicators:  Aching;Sore Pain Intervention(s): Limited activity within patient's tolerance;Monitored during session;Premedicated before session;Ice applied    Home Living Family/patient expects to be discharged to:: Private residence Living Arrangements: Spouse/significant other Available Help at Discharge: Family Type of Home: House Home Access: Stairs to enter   Secretary/administratorntrance Stairs-Number of Steps: 1 Home Layout: One level Home Equipment: Cane - single point;Shower seat      Prior Function Level of Independence: Independent               Hand Dominance        Extremity/Trunk Assessment   Upper Extremity Assessment: Overall WFL for tasks assessed           Lower Extremity Assessment: LLE deficits/detail   LLE Deficits / Details: Strength at hip 2+/5 with AAROM at hip to 75 flex and 15 abd  Cervical / Trunk Assessment: Normal  Communication   Communication: No difficulties  Cognition Arousal/Alertness: Awake/alert Behavior During Therapy: WFL for tasks assessed/performed Overall Cognitive Status: Within Functional Limits for tasks assessed                      General Comments      Exercises Total Joint Exercises Ankle Circles/Pumps: AROM;Both;15 reps;Supine Quad Sets: AROM;Both;10 reps;Supine Heel Slides: AAROM;Left;Supine;15 reps Hip ABduction/ADduction: AAROM;Left;15 reps;Supine      Assessment/Plan    PT Assessment Patient needs continued PT services  PT Diagnosis Difficulty walking  PT Problem List Decreased strength;Decreased range of motion;Decreased activity tolerance;Decreased mobility;Decreased knowledge of use of DME;Pain;Obesity  PT Treatment Interventions DME instruction;Gait training;Stair training;Functional mobility training;Therapeutic activities;Therapeutic exercise;Patient/family education   PT Goals (Current goals can be found in the Care Plan section) Acute Rehab PT Goals Patient Stated Goal: decreased pain; back to being  independent PT Goal Formulation: With patient Time For Goal Achievement: 03/06/16 Potential to Achieve Goals: Good    Frequency 7X/week   Barriers to discharge        Co-evaluation               End of Session   Activity Tolerance: Patient tolerated treatment well Patient left: in chair;with call bell/phone within reach;with family/visitor present;with chair alarm set Nurse Communication: Mobility status         Time: 1610-9604 PT Time Calculation (min) (ACUTE ONLY): 33 min   Charges:   PT Evaluation $PT Eval Low Complexity: 1 Procedure PT Treatments $Therapeutic Exercise: 8-22 mins   PT G Codes:        Sylvia [name] [name], 1:20 PM

## 2016-03-02 NOTE — Progress Notes (Signed)
[facility] ° °Patient Status: New ° °[facility] is providing the following services: Rolling walker ° °If patient discharges after hours, please call [phone].  ° °Sylvia [name] °03/02/2016, 3:54 PM ° ° °

## 2016-03-02 NOTE — Discharge Summary (Signed)
Physician Discharge Summary   Patient ID: Sylvia [name] MRN: [id number] DOB/AGE: 52-05-1966 52 y.o.  Admit date: 03/01/2016 Discharge date: 03-03-2016  Primary Diagnosis:  Osteoarthritis of the Left hip.   Admission Diagnoses:  Past Medical History  Diagnosis Date  . Diabetes mellitus without complication ([place])   . High cholesterol   . Coronary artery disease   . Chronic pain   . Arthritis     oa  . Osteoporosis   . Vertigo   . Impaired hearing     left ear  . Gastric ulcer yrs ago  . DJD (degenerative joint disease)   . Measles as child  . Mumps as child  . GERD (gastroesophageal reflux disease)     hx of, none recent   Discharge Diagnoses:   Active Problems:   OA (osteoarthritis) of hip  Estimated body mass index is 44.53 kg/(m^2) as calculated from the following:   Height as of this encounter: 5' 3.5" (1.613 m).   Weight as of this encounter: 115.849 kg (255 lb 6.4 oz).  Procedure(s) (LRB): LEFT TOTAL HIP ARTHROPLASTY ANTERIOR APPROACH (Left)   Consults: None  HPI: Sylvia [name] is a 50 y.o. female who has advanced end-  stage arthritis of her Left hip with progressively worsening pain and  dysfunction.The patient has failed nonoperative management and presents for  total hip arthroplasty.   Laboratory Data: Admission on 03/01/2016  Component Date Value Ref Range Status  . Glucose-Capillary 03/01/2016 116* 65 - 99 mg/dL Final  . Comment 1 03/01/2016 Notify RN   Final  . Glucose-Capillary 03/01/2016 159* 65 - 99 mg/dL Final  . Glucose-Capillary 03/01/2016 140* 65 - 99 mg/dL Final  . WBC 03/02/2016 11.6* 4.0 - 10.5 K/uL Final  . RBC 03/02/2016 3.60* 3.87 - 5.11 MIL/uL Final  . Hemoglobin 03/02/2016 10.2* 12.0 - 15.0 g/dL Final  . HCT 03/02/2016 32.3* 36.0 - 46.0 % Final  . MCV 03/02/2016 89.7  78.0 - 100.0 fL Final  . MCH 03/02/2016 28.3  26.0 - 34.0 pg Final  . MCHC 03/02/2016 31.6  30.0 - 36.0 g/dL Final  . RDW 03/02/2016 14.2  11.5 - 15.5 % Final    . Platelets 03/02/2016 274  150 - 400 K/uL Final  . Sodium 03/02/2016 137  135 - 145 mmol/L Final  . Potassium 03/02/2016 4.6  3.5 - 5.1 mmol/L Final  . Chloride 03/02/2016 104  101 - 111 mmol/L Final  . CO2 03/02/2016 25  22 - 32 mmol/L Final  . Glucose, Bld 03/02/2016 178* 65 - 99 mg/dL Final  . BUN 03/02/2016 11  6 - 20 mg/dL Final  . Creatinine, Ser 03/02/2016 0.96  0.44 - 1.00 mg/dL Final  . Calcium 03/02/2016 8.7* 8.9 - 10.3 mg/dL Final  . GFR calc non Af Amer 03/02/2016 >60  >60 mL/min Final  . GFR calc Af Amer 03/02/2016 >60  >60 mL/min Final   Comment: (NOTE) The eGFR has been calculated using the CKD EPI equation. This calculation has not been validated in all clinical situations. eGFR's persistently <60 mL/min signify possible Chronic Kidney Disease.   . Anion gap 03/02/2016 8  5 - 15 Final  . Glucose-Capillary 03/01/2016 200* 65 - 99 mg/dL Final  Hospital Outpatient Visit on 02/23/2016  Component Date Value Ref Range Status  . aPTT 02/23/2016 28  24 - 37 seconds Final  . WBC 02/23/2016 8.0  4.0 - 10.5 K/uL Final  . RBC 02/23/2016 4.16  3.87 - 5.11 MIL/uL Final  .  Hemoglobin 02/23/2016 11.7* 12.0 - 15.0 g/dL Final  . HCT 02/23/2016 37.3  36.0 - 46.0 % Final  . MCV 02/23/2016 89.7  78.0 - 100.0 fL Final  . MCH 02/23/2016 28.1  26.0 - 34.0 pg Final  . MCHC 02/23/2016 31.4  30.0 - 36.0 g/dL Final  . RDW 02/23/2016 14.3  11.5 - 15.5 % Final  . Platelets 02/23/2016 276  150 - 400 K/uL Final  . Sodium 02/23/2016 142  135 - 145 mmol/L Final  . Potassium 02/23/2016 4.1  3.5 - 5.1 mmol/L Final  . Chloride 02/23/2016 105  101 - 111 mmol/L Final  . CO2 02/23/2016 27  22 - 32 mmol/L Final  . Glucose, Bld 02/23/2016 158* 65 - 99 mg/dL Final  . BUN 02/23/2016 14  6 - 20 mg/dL Final  . Creatinine, Ser 02/23/2016 1.00  0.44 - 1.00 mg/dL Final  . Calcium 02/23/2016 9.3  8.9 - 10.3 mg/dL Final  . Total Protein 02/23/2016 7.7  6.5 - 8.1 g/dL Final  . Albumin 02/23/2016 3.5  3.5 -  5.0 g/dL Final  . AST 02/23/2016 23  15 - 41 U/L Final  . ALT 02/23/2016 29  14 - 54 U/L Final  . Alkaline Phosphatase 02/23/2016 71  38 - 126 U/L Final  . Total Bilirubin 02/23/2016 0.7  0.3 - 1.2 mg/dL Final  . GFR calc non Af Amer 02/23/2016 >60  >60 mL/min Final  . GFR calc Af Amer 02/23/2016 >60  >60 mL/min Final   Comment: (NOTE) The eGFR has been calculated using the CKD EPI equation. This calculation has not been validated in all clinical situations. eGFR's persistently <60 mL/min signify possible Chronic Kidney Disease.   . Anion gap 02/23/2016 10  5 - 15 Final  . Prothrombin Time 02/23/2016 14.2  11.6 - 15.2 seconds Final  . INR 02/23/2016 1.12  0.00 - 1.49 Final  . ABO/RH(D) 02/23/2016 A POS   Final  . Antibody Screen 02/23/2016 NEG   Final  . Sample Expiration 02/23/2016 03/04/2016   Final  . Extend sample reason 02/23/2016 NO TRANSFUSIONS OR PREGNANCY IN THE PAST 3 MONTHS   Final  . Color, Urine 02/23/2016 YELLOW  YELLOW Final  . APPearance 02/23/2016 CLEAR  CLEAR Final  . Specific Gravity, Urine 02/23/2016 1.015  1.005 - 1.030 Final  . pH 02/23/2016 5.5  5.0 - 8.0 Final  . Glucose, UA 02/23/2016 NEGATIVE  NEGATIVE mg/dL Final  . Hgb urine dipstick 02/23/2016 NEGATIVE  NEGATIVE Final  . Bilirubin Urine 02/23/2016 NEGATIVE  NEGATIVE Final  . Ketones, ur 02/23/2016 NEGATIVE  NEGATIVE mg/dL Final  . Protein, ur 02/23/2016 NEGATIVE  NEGATIVE mg/dL Final  . Nitrite 02/23/2016 NEGATIVE  NEGATIVE Final  . Leukocytes, UA 02/23/2016 NEGATIVE  NEGATIVE Final   MICROSCOPIC NOT DONE ON URINES WITH NEGATIVE PROTEIN, BLOOD, LEUKOCYTES, NITRITE, OR GLUCOSE <1000 mg/dL.  . Hgb A1c MFr Bld 02/23/2016 6.9* 4.8 - 5.6 % Final   Comment: (NOTE)         Pre-diabetes: 5.7 - 6.4         Diabetes: >6.4         Glycemic control for adults with diabetes: <7.0   . Mean Plasma Glucose 02/23/2016 151   Final   Comment: (NOTE) Performed At: [facility] [place], [place]  [id number] Lindon Romp MD JG:[id number]   . MRSA, PCR 02/23/2016 NEGATIVE  NEGATIVE Final  . Staphylococcus aureus 02/23/2016 POSITIVE* NEGATIVE Final   Comment:  The Xpert SA Assay (FDA approved for NASAL specimens in patients over 67 years of age), is one component of a comprehensive surveillance program.  Test performance has been validated by [facility] for patients greater than or equal to 33 year old. It is not intended to diagnose infection nor to guide or monitor treatment.   . ABO/RH(D) 02/23/2016 A POS   Final     X-Rays:Dg Pelvis Portable  03/01/2016  CLINICAL DATA:  Status post left total hip replacement EXAM: DG C-ARM 1-60 MIN-NO REPORT; PORTABLE PELVIS 1-2 VIEWS FLUOROSCOPY TIME:  0 minutes 19 seconds. No submitted fluoroscopic images COMPARISON:  None. FINDINGS: Frontal view obtained. There are total hip prostheses bilaterally with prosthetic components bilaterally appearing well-seated. No acute fracture dislocation. Drain is noted in the left hip region. IMPRESSION: Total hip prostheses bilaterally appear well seated. No acute fracture or dislocation. Electronically Signed   By: Lowella Grip III M.D.   On: 03/01/2016 13:48   Dg C-arm 1-60 Min-no Report  03/01/2016  CLINICAL DATA: surgery C-ARM 1-60 MINUTES Fluoroscopy was utilized by the requesting physician.  No radiographic interpretation.    EKG:No orders found for this or any previous visit.   Hospital Course: Patient was admitted to [facility] and taken to the OR and underwent the above state procedure without complications.  Patient tolerated the procedure well and was later transferred to the recovery room and then to the orthopaedic floor for postoperative care.  They were given PO and IV analgesics for pain control following their surgery.  They were given 24 hours of postoperative antibiotics of  Anti-infectives    Start     Dose/Rate Route Frequency Ordered Stop   03/01/16 1800   ceFAZolin (ANCEF) IVPB 2 g/50 mL premix     2 g 100 mL/hr over 30 Minutes Intravenous Every 6 hours 03/01/16 1444 03/02/16 0041   03/01/16 0831  ceFAZolin (ANCEF) IVPB 2 g/50 mL premix     2 g 100 mL/hr over 30 Minutes Intravenous On call to O.R. 03/01/16 0831 03/01/16 1131     and started on DVT prophylaxis in the form of Xarelto.   PT and OT were ordered for total hip protocol.  The patient was allowed to be WBAT with therapy. Discharge planning was consulted to help with postop disposition and equipment needs.  Patient had a good night on the evening of surgery.  They started to get up OOB with therapy on day one.  Hemovac drain was pulled without difficulty.  Continued to work with therapy into day two.  Dressing was checked and was clean and dry. Patient was seen in rounds on POD 1and was setup to go but had pain control issues and worked with therapy another day.  She was improved by POD 2 and was discharged home on 3/17.  Diet - Diabetic diet and Cardiac Diet Follow up - in 2 weeks Activity - WBAT Disposition - Home Condition Upon Discharge - improving D/C Meds - See DC Summary DVT Prophylaxis - Xarelto  Discharge Instructions    Call MD / Call 911    Complete by:  As directed   If you experience chest pain or shortness of breath, CALL 911 and be transported to the hospital emergency room.  If you develope a fever above 101 F, pus (white drainage) or increased drainage or redness at the wound, or calf pain, call your surgeon's office.     Change dressing    Complete by:  As  directed   You may change your dressing dressing daily with sterile 4 x 4 inch gauze dressing and paper tape.  Do not submerge the incision under water.     Constipation Prevention    Complete by:  As directed   Drink plenty of fluids.  Prune juice may be helpful.  You may use a stool softener, such as Colace (over the counter) 100 mg twice a day.  Use MiraLax (over the counter) for constipation as needed.      Diet - low sodium heart healthy    Complete by:  As directed      Diet Carb Modified    Complete by:  As directed      Discharge instructions    Complete by:  As directed   Pick up stool softner and laxative for home use following surgery while on pain medications. Do not submerge incision under water. May remove the surgical dressing tomorrow, Friday 03/03/2016, and then apply a dry gauze dressing daily. Please use good hand washing techniques while changing dressing each day. May shower starting three days after surgery starting Saturday 03/04/2016. Please use a clean towel to pat the incision dry following showers. Continue to use ice for pain and swelling after surgery. Do not use any lotions or creams on the incision until instructed by your surgeon.  Postoperative Constipation Protocol  Constipation - defined medically as fewer than three stools per week and severe constipation as less than one stool per week.  One of the most common issues patients have following surgery is constipation. Even if you have a regular bowel pattern at home, your normal regimen is likely to be disrupted due to multiple reasons following surgery. Combination of anesthesia, postoperative narcotics, change in appetite and fluid intake all can affect your bowels. In order to avoid complications following surgery, here are some recommendations in order to help you during your recovery period.  Colace (docusate) - Pick up an over-the-counter form of Colace or another stool softener and take twice a day as long as you are requiring postoperative pain medications. Take with a full glass of water daily. If you experience loose stools or diarrhea, hold the colace until you stool forms back up. If your symptoms do not get better within 1 week or if they get worse, check with your doctor.  Dulcolax (bisacodyl) - Pick up over-the-counter and take as directed by the product packaging as needed to assist with the  movement of your bowels. Take with a full glass of water. Use this product as needed if not relieved by Colace only.   MiraLax (polyethylene glycol) - Pick up over-the-counter to have on hand. MiraLax is a solution that will increase the amount of water in your bowels to assist with bowel movements. Take as directed and can mix with a glass of water, juice, soda, coffee, or tea. Take if you go more than two days without a movement. Do not use MiraLax more than once per day. Call your doctor if you are still constipated or irregular after using this medication for 7 days in a row.  If you continue to have problems with postoperative constipation, please contact the office for further assistance and recommendations. If you experience "the worst abdominal pain ever" or develop nausea or vomiting, please contact the office immediatly for further recommendations for treatment.  Take Xarelto for two and a half more weeks, then discontinue Xarelto. Once the patient has completed the blood thinner regimen, then take a  Baby 81 mg Aspirin daily for three more weeks.     Do not sit on low chairs, stoools or toilet seats, as it may be difficult to get up from low surfaces    Complete by:  As directed      Driving restrictions    Complete by:  As directed   No driving until released by the physician.     Increase activity slowly as tolerated    Complete by:  As directed      Lifting restrictions    Complete by:  As directed   No lifting until released by the physician.     Patient may shower    Complete by:  As directed   You may shower without a dressing once there is no drainage.  Do not wash over the wound.  If drainage remains, do not shower until drainage stops.     TED hose    Complete by:  As directed   Use stockings (TED hose) for 3 weeks on both leg(s).  You may remove them at night for sleeping.     Weight bearing as tolerated    Complete by:  As directed   Laterality:  left    Extremity:  Lower            Medication List    STOP taking these medications        diclofenac 75 MG EC tablet  Commonly known as:  VOLTAREN     ibuprofen 400 MG tablet  Commonly known as:  ADVIL,MOTRIN      TAKE these medications        atorvastatin 80 MG tablet  Commonly known as:  LIPITOR  Take 80 mg by mouth daily.     glipiZIDE 5 MG 24 hr tablet  Commonly known as:  GLUCOTROL XL  Take 5 mg by mouth daily with breakfast.     methocarbamol 500 MG tablet  Commonly known as:  ROBAXIN  Take 1 tablet (500 mg total) by mouth every 6 (six) hours as needed for muscle spasms.     oxyCODONE 5 MG immediate release tablet  Commonly known as:  Oxy IR/ROXICODONE  Take 1-2 tablets (5-10 mg total) by mouth every 3 (three) hours as needed for moderate pain or severe pain.     rivaroxaban 10 MG Tabs tablet  Commonly known as:  XARELTO  Take 1 tablet (10 mg total) by mouth daily with breakfast. Take Xarelto for two and a half more weeks, then discontinue Xarelto. Once the patient has completed the blood thinner regimen, then take a Baby 81 mg Aspirin daily for three more weeks.     traMADol 50 MG tablet  Commonly known as:  ULTRAM  Take 1-2 tablets (50-100 mg total) by mouth every 6 (six) hours as needed (mild pain).           Follow-up Information    Follow up with Gearlean Alf, MD. Schedule an appointment as soon as possible for a visit on 03/14/2016.   Specialty:  Orthopedic Surgery   Why:  Call office at [phone] to setup appointment on Tuesday 03/14/2016 with Dr. Wynelle Link.   Contact information:   [address] [place] [address] [phone]       Signed: Arlee Muslim, PA-C Orthopaedic Surgery 03/02/2016, 8:09 AM

## 2016-03-02 NOTE — Discharge Instructions (Addendum)
° °Dr. Frank Aluisio °Total Joint Specialist °[facility] °[address] °[place], [place] [address] °[phone] ° °ANTERIOR APPROACH TOTAL HIP REPLACEMENT POSTOPERATIVE DIRECTIONS ° ° °Hip Rehabilitation, Guidelines Following Surgery  °The results of a hip operation are greatly improved after range of motion and muscle strengthening exercises. Follow all safety measures which are given to protect your hip. If any of these exercises cause increased pain or swelling in your joint, decrease the amount until you are comfortable again. Then slowly increase the exercises. Call your caregiver if you have problems or questions.  ° °HOME CARE INSTRUCTIONS  °Remove items at home which could result in a fall. This includes throw rugs or furniture in walking pathways.  °· ICE to the affected hip every three hours for 30 minutes at a time and then as needed for pain and swelling.  Continue to use ice on the hip for pain and swelling from surgery. You may notice swelling that will progress down to the foot and ankle.  This is normal after surgery.  Elevate the leg when you are not up walking on it.   °· Continue to use the breathing machine which will help keep your temperature down.  It is common for your temperature to cycle up and down following surgery, especially at night when you are not up moving around and exerting yourself.  The breathing machine keeps your lungs expanded and your temperature down. ° ° °DIET °You may resume your previous home diet once your are discharged from the hospital. ° °DRESSING / WOUND CARE / SHOWERING °You may shower 3 days after surgery, but keep the wounds dry during showering.  You may use an occlusive plastic wrap (Press'n Seal for example), NO SOAKING/SUBMERGING IN THE BATHTUB.  If the bandage gets wet, change with a clean dry gauze.  If the incision gets wet, pat the wound dry with a clean towel. °You may start showering once you are discharged home but do not  submerge the incision under water. Just pat the incision dry and apply a dry gauze dressing on daily. °Change the surgical dressing daily and reapply a dry dressing each time. ° °ACTIVITY °Walk with your walker as instructed. °Use walker as long as suggested by your caregivers. °Avoid periods of inactivity such as sitting longer than an hour when not asleep. This helps prevent blood clots.  °You may resume a sexual relationship in one month or when given the OK by your doctor.  °You may return to work once you are cleared by your doctor.  °Do not drive a car for 6 weeks or until released by you surgeon.  °Do not drive while taking narcotics. ° °WEIGHT BEARING °Weight bearing as tolerated with assist device (walker, cane, etc) as directed, use it as long as suggested by your surgeon or therapist, typically at least 4-6 weeks. ° °POSTOPERATIVE CONSTIPATION PROTOCOL °Constipation - defined medically as fewer than three stools per week and severe constipation as less than one stool per week. ° °One of the most common issues patients have following surgery is constipation.  Even if you have a regular bowel pattern at home, your normal regimen is likely to be disrupted due to multiple reasons following surgery.  Combination of anesthesia, postoperative narcotics, change in appetite and fluid intake all can affect your bowels.  In order to avoid complications following surgery, here are some recommendations in order to help you during your recovery period. ° °Colace (docusate) - Pick up an over-the-counter   form of Colace or another stool softener and take twice a day as long as you are requiring postoperative pain medications.  Take with a full glass of water daily.  If you experience loose stools or diarrhea, hold the colace until you stool forms back up.  If your symptoms do not get better within 1 week or if they get worse, check with your doctor. ° °Dulcolax (bisacodyl) - Pick up over-the-counter and take as directed  by the product packaging as needed to assist with the movement of your bowels.  Take with a full glass of water.  Use this product as needed if not relieved by Colace only.  ° °MiraLax (polyethylene glycol) - Pick up over-the-counter to have on hand.  MiraLax is a solution that will increase the amount of water in your bowels to assist with bowel movements.  Take as directed and can mix with a glass of water, juice, soda, coffee, or tea.  Take if you go more than two days without a movement. °Do not use MiraLax more than once per day. Call your doctor if you are still constipated or irregular after using this medication for 7 days in a row. ° °If you continue to have problems with postoperative constipation, please contact the office for further assistance and recommendations.  If you experience "the worst abdominal pain ever" or develop nausea or vomiting, please contact the office immediatly for further recommendations for treatment. ° °ITCHING ° If you experience itching with your medications, try taking only a single pain pill, or even half a pain pill at a time.  You can also use Benadryl over the counter for itching or also to help with sleep.  ° °TED HOSE STOCKINGS °Wear the elastic stockings on both legs for three weeks following surgery during the day but you may remove then at night for sleeping. ° °MEDICATIONS °See your medication summary on the “After Visit Summary” that the nursing staff will review with you prior to discharge.  You may have some home medications which will be placed on hold until you complete the course of blood thinner medication.  It is important for you to complete the blood thinner medication as prescribed by your surgeon.  Continue your approved medications as instructed at time of discharge. ° °PRECAUTIONS °If you experience chest pain or shortness of breath - call 911 immediately for transfer to the hospital emergency department.  °If you develop a fever greater that 101 F,  purulent drainage from wound, increased redness or drainage from wound, foul odor from the wound/dressing, or calf pain - CONTACT YOUR SURGEON.   °                                                °FOLLOW-UP APPOINTMENTS °Make sure you keep all of your appointments after your operation with your surgeon and caregivers. You should call the office at the above phone number and make an appointment for approximately two weeks after the date of your surgery or on the date instructed by your surgeon outlined in the "After Visit Summary". ° °RANGE OF MOTION AND STRENGTHENING EXERCISES  °These exercises are designed to help you keep full movement of your hip joint. Follow your caregiver's or physical therapist's instructions. Perform all exercises about fifteen times, three times per day or as directed. Exercise both hips, even if you   have had only one joint replacement. These exercises can be done on a training (exercise) mat, on the floor, on a table or on a bed. Use whatever works the best and is most comfortable for you. Use music or television while you are exercising so that the exercises are a pleasant break in your day. This will make your life better with the exercises acting as a break in routine you can look forward to.  °Lying on your back, slowly slide your foot toward your buttocks, raising your knee up off the floor. Then slowly slide your foot back down until your leg is straight again.  °Lying on your back spread your legs as far apart as you can without causing discomfort.  °Lying on your side, raise your upper leg and foot straight up from the floor as far as is comfortable. Slowly lower the leg and repeat.  °Lying on your back, tighten up the muscle in the front of your thigh (quadriceps muscles). You can do this by keeping your leg straight and trying to raise your heel off the floor. This helps strengthen the largest muscle supporting your knee.  °Lying on your back, tighten up the muscles of your  buttocks both with the legs straight and with the knee bent at a comfortable angle while keeping your heel on the floor.  ° °IF YOU ARE TRANSFERRED TO A SKILLED REHAB FACILITY °If the patient is transferred to a skilled rehab facility following release from the hospital, a list of the current medications will be sent to the facility for the patient to continue.  When discharged from the skilled rehab facility, please have the facility set up the patient's Home Health Physical Therapy prior to being released. Also, the skilled facility will be responsible for providing the patient with their medications at time of release from the facility to include their pain medication, the muscle relaxants, and their blood thinner medication. If the patient is still at the rehab facility at time of the two week follow up appointment, the skilled rehab facility will also need to assist the patient in arranging follow up appointment in our office and any transportation needs. ° °MAKE SURE YOU:  °Understand these instructions.  °Get help right away if you are not doing well or get worse.  ° ° °Pick up stool softner and laxative for home use following surgery while on pain medications. °Do not submerge incision under water. °Please use good hand washing techniques while changing dressing each day. °May shower starting three days after surgery. °Please use a clean towel to pat the incision dry following showers. °Continue to use ice for pain and swelling after surgery. °Do not use any lotions or creams on the incision until instructed by your surgeon. ° °Take Xarelto for two and a half more weeks, then discontinue Xarelto. °Once the patient has completed the blood thinner regimen, then take a Baby 81 mg Aspirin daily for three more weeks. ° ° °Information on my medicine - XARELTO® (Rivaroxaban) ° °This medication education was reviewed with me or my healthcare representative as part of my discharge preparation.  The pharmacist that  spoke with me during my hospital stay was:  Runyon, Amanda, RPH ° °Why was Xarelto® prescribed for you? °Xarelto® was prescribed for you to reduce the risk of blood clots forming after orthopedic surgery. The medical term for these abnormal blood clots is venous thromboembolism (VTE). ° °What do you need to know about xarelto® ? °Take your Xarelto® ONCE   DAILY at the same time every day. °You may take it either with or without food. ° °If you have difficulty swallowing the tablet whole, you may crush it and mix in applesauce just prior to taking your dose. ° °Take Xarelto® exactly as prescribed by your doctor and DO NOT stop taking Xarelto® without talking to the doctor who prescribed the medication.  Stopping without other VTE prevention medication to take the place of Xarelto® may increase your risk of developing a clot. ° °After discharge, you should have regular check-up appointments with your healthcare provider that is prescribing your Xarelto®.   ° °What do you do if you miss a dose? °If you miss a dose, take it as soon as you remember on the same day then continue your regularly scheduled once daily regimen the next day. Do not take two doses of Xarelto® on the same day.  ° °Important Safety Information °A possible side effect of Xarelto® is bleeding. You should call your healthcare provider right away if you experience any of the following: °? Bleeding from an injury or your nose that does not stop. °? Unusual colored urine (red or dark brown) or unusual colored stools (red or black). °? Unusual bruising for unknown reasons. °? A serious fall or if you hit your head (even if there is no bleeding). ° °Some medicines may interact with Xarelto® and might increase your risk of bleeding while on Xarelto®. To help avoid this, consult your healthcare provider or pharmacist prior to using any new prescription or non-prescription medications, including herbals, vitamins, non-steroidal anti-inflammatory drugs (NSAIDs)  and supplements. ° °This website has more information on Xarelto®: www.xarelto.com. ° ° ° °

## 2016-03-02 NOTE — Evaluation (Signed)
Occupational Therapy Evaluation Patient Details Name: Sylvia [name] MRN: [id number] DOB: [birth year] Today's Date: 03/02/2016    History of Present Illness s/p L DA THA   Clinical Impression   This 50 year old female was admitted for the above surgery. All education was completed.  No further OT is needed at this time    Follow Up Recommendations  No OT follow up;Supervision/Assistance - 24 hour    Equipment Recommendations  None recommended by OT    Recommendations for Other Services       Precautions / Restrictions Precautions Precautions: Fall Restrictions Weight Bearing Restrictions: No      Mobility Bed Mobility               General bed mobility comments: oob  Transfers Overall transfer level: Needs assistance Equipment used: Rolling walker (2 wheeled) Transfers: Sit to/from Stand Sit to Stand: Min assist         General transfer comment: steadying assistance; assist to extend LLE when sitting and cues for UE/LE placement    Balance                                            ADL Overall ADL's : Needs assistance/impaired     Grooming: Wash/dry hands;Standing;Supervision/safety   Upper Body Bathing: Set up;Sitting   Lower Body Bathing: Minimal assistance;Sit to/from stand   Upper Body Dressing : Set up;Sitting   Lower Body Dressing: Moderate assistance;Sit to/from stand   Toilet Transfer: Minimal assistance;Ambulation;RW;Comfort height toilet;Grab bars   Toileting- Clothing Manipulation and Hygiene: Supervision/safety;Sit to/from stand         General ADL Comments: performed ADL in bathroom.  Pt will have assistance and does not want AE.  Pt needed assistance to extend LLE when sitting     Vision     Perception     Praxis      Pertinent Vitals/Pain Pain Assessment: 0-10 Pain Score: 3  Pain Location: L hip Pain Descriptors / Indicators: Aching Pain Intervention(s): Limited activity within patient's  tolerance;Premedicated before session;Repositioned;Ice applied     Hand Dominance     Extremity/Trunk Assessment Upper Extremity Assessment Upper Extremity Assessment: Overall WFL for tasks assessed           Communication Communication Communication: No difficulties   Cognition Arousal/Alertness: Awake/alert Behavior During Therapy: WFL for tasks assessed/performed Overall Cognitive Status: Within Functional Limits for tasks assessed                     General Comments       Exercises       Shoulder Instructions      Home Living Family/patient expects to be discharged to:: Private residence Living Arrangements: Spouse/significant other Available Help at Discharge: Family                   Bathroom Toilet: Handicapped height;sink next to it                Prior Functioning/Environment Level of Independence: Independent             OT Diagnosis: Acute pain;Generalized weakness   OT Problem List:     OT Treatment/Interventions:      OT Goals(Current goals can be found in the care plan section) Acute Rehab OT Goals Patient Stated Goal: decreased pain; back to being independent OT Goal Formulation: All  assessment and education complete, DC therapy  OT Frequency:     Barriers to D/C:            Co-evaluation              End of Session    Activity Tolerance: Patient tolerated treatment well Patient left: in chair;with call bell/phone within reach;with chair alarm set   Time: 1135-1204 OT Time Calculation (min): 29 min Charges:  OT General Charges $OT Visit: 1 Procedure OT Evaluation $OT Eval Low Complexity: 1 Procedure OT Treatments $Self Care/Home Management : 8-22 mins G-Codes:    Kino Dunsworth 03/02/2016, 12:41 PM   Marica OtterMaryellen Tinita Brooker, OTR/L [phone] 03/02/2016

## 2016-03-03 LAB — CBC
HEMATOCRIT: 30.2 % — AB (ref 36.0–46.0)
Hemoglobin: 9.5 g/dL — ABNORMAL LOW (ref 12.0–15.0)
MCH: 27.1 pg (ref 26.0–34.0)
MCHC: 31.5 g/dL (ref 30.0–36.0)
MCV: 86 fL (ref 78.0–100.0)
PLATELETS: 223 10*3/uL (ref 150–400)
RBC: 3.51 MIL/uL — ABNORMAL LOW (ref 3.87–5.11)
RDW: 14.7 % (ref 11.5–15.5)
WBC: 18.6 10*3/uL — AB (ref 4.0–10.5)

## 2016-03-03 LAB — BASIC METABOLIC PANEL
ANION GAP: 7 (ref 5–15)
BUN: 19 mg/dL (ref 6–20)
CALCIUM: 8.8 mg/dL — AB (ref 8.9–10.3)
CO2: 25 mmol/L (ref 22–32)
Chloride: 108 mmol/L (ref 101–111)
Creatinine, Ser: 0.8 mg/dL (ref 0.44–1.00)
Glucose, Bld: 164 mg/dL — ABNORMAL HIGH (ref 65–99)
Potassium: 4 mmol/L (ref 3.5–5.1)
SODIUM: 140 mmol/L (ref 135–145)

## 2016-03-03 LAB — GLUCOSE, CAPILLARY
GLUCOSE-CAPILLARY: 108 mg/dL — AB (ref 65–99)
GLUCOSE-CAPILLARY: 120 mg/dL — AB (ref 65–99)

## 2016-03-03 NOTE — Clinical Documentation Improvement (Signed)
Orthopedic  Based on the clinical findings below, please document any associated diagnoses/conditions the patient has or may have.   BMI 44.6  Other  Clinically Undetermined  Please exercise your independent, professional judgment when responding. A specific answer is not anticipated or expected.  Thank You, Beverley FiedlerLaurie E Kaitlin Ardito RN [facility] [employer] [facility] [place] [phone]

## 2016-03-03 NOTE — Progress Notes (Signed)
Physical Therapy Treatment Patient Details Name: Sylvia [name] MRN: [id number] DOB: 1966-05-18 Today's Date: 03/03/2016    History of Present Illness s/p L DA THA    PT Comments    Pt limited this pm by c/o abdominal pain/distention as well as hip pain.  Pt ambulated limited distance and up/down single step twice before requiring return trip to bathroom.  Follow Up Recommendations  Home health PT     Equipment Recommendations  Rolling walker with 5" wheels    Recommendations for Other Services OT consult     Precautions / Restrictions Precautions Precautions: Fall Restrictions Weight Bearing Restrictions: No Other Position/Activity Restrictions: WBAT    Mobility  Bed Mobility Overal bed mobility: Needs Assistance Bed Mobility: Supine to Sit     Supine to sit: Min assist     General bed mobility comments: assist with L LE and increased time to scoot and sit up due to BMI.  Transfers Overall transfer level: Needs assistance Equipment used: Rolling walker (2 wheeled) Transfers: Sit to/from Stand Sit to Stand: Min guard;From elevated surface;Min assist         General transfer comment: uincreased time requiring assist to position L LE in extension prior to sit.  assisted with sit to stand from elevated bed and stand to sit on elevated commode.   Ambulation/Gait Ambulation/Gait assistance: Min guard Ambulation Distance (Feet): 38 Feet Assistive device: Rolling walker (2 wheeled) Gait Pattern/deviations: Step-to pattern;Decreased step length - right;Decreased step length - left;Shuffle;Trunk flexed Gait velocity: decreased Gait velocity interpretation: Below normal speed for age/gender General Gait Details: Cues for posture, sequence and position from RW.  Continued difficulty advancing L LE but not as significant as this am   Stairs Stairs: Yes Stairs assistance: Min assist Stair Management: No rails;Step to pattern;Backwards;With walker Number of Stairs:  2 General stair comments: single step twice bkwd with RW and cues for sequence and foot/RW placement.  Wheelchair Mobility    Modified Rankin (Stroke Patients Only)       Balance                                    Cognition Arousal/Alertness: Awake/alert Behavior During Therapy: WFL for tasks assessed/performed Overall Cognitive Status: Within Functional Limits for tasks assessed                      Exercises      General Comments        Pertinent Vitals/Pain Pain Assessment: 0-10 Pain Score: 6  Pain Location: L hip pain and abdominal pain Pain Descriptors / Indicators: Aching;Grimacing;Sore Pain Intervention(s): Limited activity within patient's tolerance;Monitored during session;Premedicated before session    Home Living                      Prior Function            PT Goals (current goals can now be found in the care plan section) Acute Rehab PT Goals Patient Stated Goal: decreased pain; back to being independent PT Goal Formulation: With patient Time For Goal Achievement: 03/06/16 Potential to Achieve Goals: Good Progress towards PT goals: Progressing toward goals    Frequency  7X/week    PT Plan Current plan remains appropriate    Co-evaluation             End of Session Equipment Utilized During Treatment: Gait belt Activity Tolerance: Patient  limited by fatigue;Patient limited by pain;No increased pain Patient left: Other (comment) (bathroom)     Time: [id number] PT Time Calculation (min) (ACUTE ONLY): 22 min  Charges:  $Gait Training: 8-22 mins                    G Codes:      Alvenia Treese 03/03/2016, 5:34 PM

## 2016-03-03 NOTE — Progress Notes (Signed)
PT Cancellation Note  Patient Details Name: Sylvia [name] MRN: [id number] DOB: 04/11/1966   Cancelled Treatment:     attempted to see twice this morning.  Pt back and forth to the bathroom with c/o constipation and nausea.  Will attempt this afternoon.   Armando ReichertKropski, Haile Toppins Ann 03/03/2016, 12:29 PM

## 2016-03-03 NOTE — Progress Notes (Signed)
Physical Therapy Treatment Patient Details Name: Sylvia [name] MRN: [id number] DOB: 11/26/1966 Today's Date: 03/03/2016    History of Present Illness s/p L DA THA    PT Comments    POD # 2 pm session. Assisted pt OOB to amb to bathroom with much difficulty.  Pt unable to functionally advance L LE due to pain level and fatigue.   Nearly 7 min to amb 10 feet while performing "toe scrunch gait".  Max c/o ABD distention and inability to have a BM.  Mod c/o nausea on exertion.  Pt in bathroom extended amount of time.  Reported to RN that pt's mobility inhibits her from attempting the one step she has to enter home.    Follow Up Recommendations  Home health PT     Equipment Recommendations  Rolling walker with 5" wheels    Recommendations for Other Services       Precautions / Restrictions Precautions Precautions: Fall Restrictions Weight Bearing Restrictions: No Other Position/Activity Restrictions: WBAT    Mobility  Bed Mobility Overal bed mobility: Needs Assistance Bed Mobility: Supine to Sit     Supine to sit: Min assist     General bed mobility comments: assist with L LE and increased time to scoot and sit up due to BMI.  Transfers Overall transfer level: Needs assistance Equipment used: Rolling walker (2 wheeled) Transfers: Sit to/from Stand Sit to Stand: Min assist         General transfer comment: uincreased time requiring assist to position L LE in extension prior to sit.  assisted with sit to stand from elevated bed and stand to sit on elevated commode.   Ambulation/Gait Ambulation/Gait assistance: Min assist Ambulation Distance (Feet): 10 Feet Assistive device: Rolling walker (2 wheeled) Gait Pattern/deviations: Step-to pattern;Decreased step length - right;Decreased step length - left Gait velocity: decreased   General Gait Details: great difficulty self advancing L LE with max c/o "soreness".  Also required increased, increased time.  Nearly seven min  needed to amb from bed to bathroom.     Stairs            Wheelchair Mobility    Modified Rankin (Stroke Patients Only)       Balance                                    Cognition Arousal/Alertness: Awake/alert Behavior During Therapy: WFL for tasks assessed/performed Overall Cognitive Status: Within Functional Limits for tasks assessed                      Exercises      General Comments        Pertinent Vitals/Pain Pain Assessment: 0-10 Pain Score: 5  Pain Location: L hip pain but more c/o ABD distention/constipation/inability to have a BM Pain Descriptors / Indicators: Grimacing Pain Intervention(s): Monitored during session;Repositioned    Home Living                      Prior Function            PT Goals (current goals can now be found in the care plan section) Progress towards PT goals: Progressing toward goals    Frequency  7X/week    PT Plan Current plan remains appropriate    Co-evaluation             End of Session Equipment Utilized  During Treatment: Gait belt Activity Tolerance: No increased pain;Patient limited by fatigue;Other (comment) (bowel issues) Patient left: Other (comment) (in bathroom/notified RN)     Time: 1345-1400 PT Time Calculation (min) (ACUTE ONLY): 15 min  Charges:  $Gait Training: 8-22 mins                    G Codes:      Felecia Shelling  PTA [facility]  Acute  Rehab Pager      [phone]

## 2016-06-29 ENCOUNTER — Emergency Department (HOSPITAL_BASED_OUTPATIENT_CLINIC_OR_DEPARTMENT_OTHER): Payer: Medicare PPO

## 2016-06-29 ENCOUNTER — Emergency Department (HOSPITAL_BASED_OUTPATIENT_CLINIC_OR_DEPARTMENT_OTHER)
Admission: EM | Admit: 2016-06-29 | Discharge: 2016-06-29 | Disposition: A | Discharge status: Home / Self Care | Payer: Medicare PPO | Attending: Emergency Medicine | Admitting: Emergency Medicine

## 2016-06-29 ENCOUNTER — Encounter (HOSPITAL_BASED_OUTPATIENT_CLINIC_OR_DEPARTMENT_OTHER): Payer: Self-pay | Admitting: *Deleted

## 2016-06-29 DIAGNOSIS — M1990 Unspecified osteoarthritis, unspecified site: Secondary | ICD-10-CM | POA: Diagnosis not present

## 2016-06-29 DIAGNOSIS — Z87891 Personal history of nicotine dependence: Secondary | ICD-10-CM | POA: Insufficient documentation

## 2016-06-29 DIAGNOSIS — Z79899 Other long term (current) drug therapy: Secondary | ICD-10-CM | POA: Insufficient documentation

## 2016-06-29 DIAGNOSIS — E119 Type 2 diabetes mellitus without complications: Secondary | ICD-10-CM | POA: Diagnosis not present

## 2016-06-29 DIAGNOSIS — Z7984 Long term (current) use of oral hypoglycemic drugs: Secondary | ICD-10-CM | POA: Insufficient documentation

## 2016-06-29 DIAGNOSIS — M7632 Iliotibial band syndrome, left leg: Secondary | ICD-10-CM | POA: Insufficient documentation

## 2016-06-29 DIAGNOSIS — M79605 Pain in left leg: Secondary | ICD-10-CM | POA: Diagnosis present

## 2016-06-29 DIAGNOSIS — M810 Age-related osteoporosis without current pathological fracture: Secondary | ICD-10-CM | POA: Insufficient documentation

## 2016-06-29 MED ORDER — BUPIVACAINE HCL (PF) 0.5 % IJ SOLN
10.0000 mL | Freq: Once | INTRAMUSCULAR | Status: AC
  Administered 2016-06-29: 10 mL
  Filled 2016-06-29: qty 10

## 2016-06-29 NOTE — ED Provider Notes (Signed)
CSN: [id number]     Arrival date & time 06/29/16  2039 History    By signing my name below, I, Suzan Slick. Elon Spanner, attest that this documentation has been prepared under the direction and in the presence of Lyndal Pulley, MD.  Electronically Signed: Suzan Slick. Elon Spanner, ED Scribe. 06/29/2016. 9:06 PM.   Chief Complaint  Patient presents with  . Leg Pain   The history is provided by the patient. No language interpreter was used.    HPI Comments: Sylvia [name] is a 50 y.o. female with a PMHx of L total hip arthroplasty performed 03/01/16 who presents to the Emergency Department complaining of sudden onset, unchanged L leg pain with associated swelling onset earlier this afternoon. Pt described discomfort as "a flame of fire going through my leg". Pain initially noted while at work after she heard a "click" in her hip. Discomfort is made worse with palpation and certain movement. No alleviating factors at this time. No OTC medication or home remedies attempted prior to arrival. No recent fever or chills.  PCP: Murrell Converse    Past Medical History  Diagnosis Date  . Diabetes mellitus without complication (HCC)   . High cholesterol   . Coronary artery disease   . Chronic pain   . Arthritis     oa  . Osteoporosis   . Vertigo   . Impaired hearing     left ear  . Gastric ulcer yrs ago  . DJD (degenerative joint disease)   . Measles as child  . Mumps as child  . GERD (gastroesophageal reflux disease)     hx of, none recent   Past Surgical History  Procedure Laterality Date  . Abdominal hysterectomy      partial  . L4 to l 5 disc surgery    . Joint replacement Right 2014    hip replacement  . Tubal ligation    . Uterine fibroid surgery    . Total hip arthroplasty Left 03/01/2016    Procedure: LEFT TOTAL HIP ARTHROPLASTY ANTERIOR APPROACH;  Surgeon: Ollen Gross, MD;  Location: [facility];  Service: Orthopedics;  Laterality: Left;   No family history on file. Social History   Substance Use Topics  . Smoking status: Former Smoker    Types: Cigarettes    Quit date: 12/19/2015  . Smokeless tobacco: Never Used  . Alcohol Use: Yes     Comment: occasional wine   OB History    No data available     Review of Systems  Constitutional: Negative for fever and chills.  Musculoskeletal: Positive for joint swelling and arthralgias.  All other systems reviewed and are negative.     Allergies  Contrast media and Demerol  Home Medications   Prior to Admission medications   Medication Sig Start Date End Date Taking? Authorizing Provider  atorvastatin (LIPITOR) 80 MG tablet Take 80 mg by mouth daily.   Yes Historical Provider, MD  glipiZIDE (GLUCOTROL XL) 5 MG 24 hr tablet Take 5 mg by mouth daily with breakfast.   Yes Historical Provider, MD  methocarbamol (ROBAXIN) 500 MG tablet Take 1 tablet (500 mg total) by mouth every 6 (six) hours as needed for muscle spasms. 03/02/16  Yes Avel Peace, PA-C  oxyCODONE (OXY IR/ROXICODONE) 5 MG immediate release tablet Take 1-2 tablets (5-10 mg total) by mouth every 3 (three) hours as needed for moderate pain or severe pain. 03/02/16  Yes Avel Peace, PA-C  rivaroxaban (XARELTO) 10 MG TABS tablet Take 1  tablet (10 mg total) by mouth daily with breakfast. Take Xarelto for two and a half more weeks, then discontinue Xarelto. Once the patient has completed the blood thinner regimen, then take a Baby 81 mg Aspirin daily for three more weeks. 03/02/16   Avel Peace, PA-C  traMADol (ULTRAM) 50 MG tablet Take 1-2 tablets (50-100 mg total) by mouth every 6 (six) hours as needed (mild pain). 03/02/16   Avel Peace, PA-C   Triage Vitals: BP 142/88 mmHg  Pulse 91  Temp(Src) 98.7 F (37.1 C) (Oral)  Resp 16  Ht  (1.6 m)  Wt 240 lb (108.863 kg)  BMI 42.52 kg/m2  SpO2 100%   Physical Exam  Constitutional: She is oriented to person, place, and time. She appears well-developed and well-nourished. No distress.  HENT:  Head:  Normocephalic.  Eyes: Conjunctivae and EOM are normal.  Neck: Normal range of motion. Neck supple. No tracheal deviation present.  Cardiovascular: Normal rate and regular rhythm.   Pulmonary/Chest: Effort normal. No respiratory distress.  Abdominal: Soft. She exhibits no distension.  Musculoskeletal: Normal range of motion. She exhibits tenderness.  Tenderness and spasm of upper L IT band.  Neurological: She is alert and oriented to person, place, and time.  Skin: Skin is warm and dry.  Psychiatric: She has a normal mood and affect.  Nursing note and vitals reviewed.   ED Course  Procedures (including critical care time)  Procedure Note: Trigger Point Injection for Myofascial pain  Performed by Dr. Clydene Pugh Indication: muscle/myofascial pain Muscle body and tendon sheath of the left lateral thigh muscle(s) were injected with 0.5% bupivacaine under sterile technique for release of muscle spasm/pain. Patient tolerated well with immediate improvement of symptoms and no immediate complications following procedure.  CPT Code:   1 or 2 muscle bodies: 20552  DIAGNOSTIC STUDIES: Oxygen Saturation is 100% on RA, Normal by my interpretation.    COORDINATION OF CARE: 9:05 PM- Will order imaging. Will give Bupivacaine injection. Discussed treatment plan with pt at bedside and pt agreed to plan.     Labs Review Labs Reviewed - No data to display  Imaging Review Dg Hip Unilat With Pelvis 2-3 Views Left  06/29/2016  CLINICAL DATA:  50 year old female with left hip pain. EXAM: DG HIP (WITH OR WITHOUT PELVIS) 2-3V LEFT COMPARISON:  Radiograph dated 03/01/2016 FINDINGS: There is no acute fracture or dislocation. Bilateral total hip arthroplasties appear intact. There is no evidence of hardware loosening. Stable heterotopic bone formation noted adjacent to the neck of the femoral component on the right. Ectopic bone formation noted adjacent to the superior aspect of the left acetabular cup which is  similar or slightly increased from prior study. There is degenerative changes of the lower lumbar spine. The soft tissues appear unremarkable. IMPRESSION: No acute findings. Electronically Signed   By: Elgie Collard M.D.   On: 06/29/2016 21:44   I have personally reviewed and evaluated these images and lab results as part of my medical decision-making.   EKG Interpretation None      MDM   Final diagnoses:  IT band syndrome, left    50 y.o. female presents with Pain of the left upper outer thigh and feeling of popping around her left hip replacement. Hardware is in appropriate location without evidence of complication currently. She has point tenderness over her lateral upper thigh consistent with an IT band inflammation likely secondary to gait following hip replacement. Trigger point injection was applied to the area of concern with  great relief of symptoms. Plan to follow up with her orthopedic surgeon as needed for reevaluation.  I personally performed the services described in this documentation, which was scribed in my presence. The recorded information has been reviewed and is accurate.    Lyndal Pulleyaniel Shah Insley, MD 06/29/16 2259

## 2016-06-29 NOTE — Discharge Instructions (Signed)
Iliotibial Band Syndrome  Iliotibial band syndrome is pain in the outer, lower thigh. The pain is caused by an inflammation of the iliotibial band. This is a band of thick fibrous tissue that runs down the outside of the thigh. The iliotibial band begins at the hip. It extends to the outer side of the shin bone (tibia) just below the knee joint. The band works with the thigh muscles. Together they provide stability to the outside of the knee joint.  Iliotibial band syndrome occurs when there is inflammation to this band of tissue. This is typically due to over use and not due to an injury. The irritation usually occurs over the outside of the knee joint, at the the end of the thigh bone (femur). The iliotibial band crosses bone and muscle at this point. Between these structures is a cushioning sac (bursa). The bursa should make possible a smooth gliding motion. However, when inflamed, the iliotibial band does not glide easily. When inflamed, there is pain with motion of the knee. Usually the pain worsens with continued movement and the pain goes away with rest.  This problem usually arises when there is a sudden increase in sports activities involving your legs. Running, and playing soccer or basketball are examples of activities causing this. Others who are prone to iliotibial band syndrome include individuals with mechanical problems such as leg length differences, abnormality of walking, bowed legs etc.  HOME CARE INSTRUCTIONS   · Apply ice to the injured area:    Put ice in a plastic bag.    Place a towel between your skin and the bag.    Leave the ice on for 20 minutes, 2-3 times a day.  · Limit excessive training or eliminate training until pain goes away.  · While pain is present, you may use gentle range of motion. Do not resume regular use until instructed by your health care provider. Begin use gradually. Do not increase activity to the point of pain. If pain does develop, decrease activity and continue  the above measures. Gradually increase activities that do not cause discomfort. Do this until you finally achieve normal use.  · Perform low-impact activities while pain is present. Wear proper footwear.  · Only take over-the-counter or prescription medicines for pain, discomfort, or fever as directed by your health care provider.  SEEK MEDICAL CARE IF:   · Your pain increases or pain is not controlled with medications.  · You develop new, unexplained symptoms, or an increase of the symptoms that brought you to your health care provider.  · Your pain and symptoms are not improving or are getting worse.     This information is not intended to replace advice given to you by your health care provider. Make sure you discuss any questions you have with your health care provider.     Document Released: 05/26/2002 Document Revised: 12/25/2014 Document Reviewed: 07/03/2013  Elsevier Interactive Patient Education ©2016 [employer]

## 2016-06-29 NOTE — ED Notes (Signed)
Left leg pain. States she was at work and had a sudden onset of pain and clicking in her left upper leg and hip. Hx of hip replacement in March.

## 2016-06-29 NOTE — ED Notes (Signed)
MD back at bedside.

## 2017-02-26 ENCOUNTER — Encounter (HOSPITAL_BASED_OUTPATIENT_CLINIC_OR_DEPARTMENT_OTHER): Payer: Self-pay | Admitting: *Deleted

## 2017-02-26 ENCOUNTER — Emergency Department (HOSPITAL_BASED_OUTPATIENT_CLINIC_OR_DEPARTMENT_OTHER): Payer: Medicare PPO

## 2017-02-26 ENCOUNTER — Emergency Department (HOSPITAL_BASED_OUTPATIENT_CLINIC_OR_DEPARTMENT_OTHER)
Admission: EM | Admit: 2017-02-26 | Discharge: 2017-02-26 | Disposition: A | Discharge status: Home / Self Care | Payer: Medicare PPO | Attending: Emergency Medicine | Admitting: Emergency Medicine

## 2017-02-26 DIAGNOSIS — I2510 Atherosclerotic heart disease of native coronary artery without angina pectoris: Secondary | ICD-10-CM | POA: Diagnosis not present

## 2017-02-26 DIAGNOSIS — Z7984 Long term (current) use of oral hypoglycemic drugs: Secondary | ICD-10-CM | POA: Diagnosis not present

## 2017-02-26 DIAGNOSIS — E119 Type 2 diabetes mellitus without complications: Secondary | ICD-10-CM | POA: Insufficient documentation

## 2017-02-26 DIAGNOSIS — M79641 Pain in right hand: Secondary | ICD-10-CM | POA: Diagnosis not present

## 2017-02-26 DIAGNOSIS — Z87891 Personal history of nicotine dependence: Secondary | ICD-10-CM | POA: Diagnosis not present

## 2017-02-26 MED ORDER — METHOCARBAMOL 500 MG PO TABS: 500.0000 mg | ORAL_TABLET | Freq: Four times a day (QID) | ORAL | 0 refills | Status: DC | PRN

## 2017-02-26 MED ORDER — TRAMADOL HCL 50 MG PO TABS: 50.0000 mg | ORAL_TABLET | Freq: Four times a day (QID) | ORAL | 0 refills | Status: DC | PRN

## 2017-02-26 NOTE — ED Provider Notes (Signed)
[facility]   CSN: [id number] Arrival date & time: 02/26/17  2010   By signing my name below, I, Sylvia [name], attest that this documentation has been prepared under the direction and in the presence of Fayrene HelperBowie Thai Burgueno, PA-C Electronically Signed: Soijett [name], ED Scribe. 02/26/17. 10:01 PM.  History   Chief Complaint Chief Complaint  Patient presents with  . Hand Injury    HPI Sylvia [name] is a 51 y.o. female with a PMHx of DM, arthritis, who presents to the Emergency Department complaining of intermittent, gradually increasing, right hand pain onset 1 month ago. Pt reports associated intermittent swelling to right index finger, tingling to right index finger, and generalized weakness to right hand due to pain. Pt has tried ASA with no relief of her symptoms. Pt right hand pain is worsened with palpation and she states that she is right hand dominant. She notes that it feels like gout and she has had a hx of gout in her toes. 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 Denies CP, SOB, right wrist pain, right forearm pain, right elbow pain and any other symptoms.     The history is provided by the patient. No language interpreter was used.    Past Medical History:  Diagnosis Date  . Arthritis    oa  . Chronic pain   . Coronary artery disease   . Diabetes mellitus without complication (HCC)   . DJD (degenerative joint disease)   . Gastric ulcer yrs ago  . GERD (gastroesophageal reflux disease)    hx of, none recent  . High cholesterol   . Impaired hearing    left ear  . Measles as child  . Mumps as child  . Osteoporosis   . Vertigo     Patient Active Problem List   Diagnosis Date Noted  . OA (osteoarthritis) of hip 03/01/2016    Past Surgical History:  Procedure Laterality Date  . ABDOMINAL HYSTERECTOMY     partial  . JOINT REPLACEMENT Right 2014   hip replacement  . L4 to L 5 disc surgery    . TOTAL HIP ARTHROPLASTY Left 03/01/2016   Procedure: LEFT TOTAL HIP ARTHROPLASTY ANTERIOR APPROACH;  Surgeon: Ollen GrossFrank Aluisio, MD;  Location: [facility];  Service: Orthopedics;  Laterality: Left;  . TUBAL LIGATION    . UTERINE FIBROID SURGERY      OB History    No data available       Home Medications    Prior to Admission medications   Medication Sig Start Date End Date Taking? Authorizing Provider  atorvastatin (LIPITOR) 80 MG tablet Take 80 mg by mouth daily.   Yes Historical Provider, MD  glipiZIDE (GLUCOTROL XL) 5 MG 24 hr tablet Take 5 mg by mouth daily with breakfast.   Yes Historical Provider, MD  methocarbamol (ROBAXIN) 500 MG tablet Take 1 tablet (500 mg total) by mouth every 6 (six) hours as needed for muscle spasms. 03/02/16   Alexzandrew L Perkins, PA-C  oxyCODONE (OXY IR/ROXICODONE) 5 MG immediate release tablet Take 1-2 tablets (5-10 mg total) by mouth every 3 (three) hours as needed for moderate pain or severe pain. 03/02/16   Alexzandrew L Perkins, PA-C  rivaroxaban (XARELTO) 10 MG TABS tablet Take 1 tablet (10 mg total) by mouth daily with breakfast. Take Xarelto for two and a half more weeks, then discontinue Xarelto. Once the patient has completed the blood thinner regimen, then take a Baby 81 mg Aspirin daily for  three more weeks. 03/02/16   Alexzandrew L Perkins, PA-C  traMADol (ULTRAM) 50 MG tablet Take 1-2 tablets (50-100 mg total) by mouth every 6 (six) hours as needed (mild pain). 03/02/16   Alexzandrew Tessie Fass, PA-C    Family History No family history on file.  Social History Social History  Substance Use Topics  . Smoking status: Former Smoker    Types: Cigarettes    Quit date: 12/19/2015  . Smokeless tobacco: Never Used  . Alcohol use Yes     Comment: occasional wine     Allergies   Contrast media [iodinated diagnostic agents] and Demerol [meperidine]   Review of Systems Review of Systems  Respiratory: Negative for shortness of breath.   Cardiovascular: Negative for chest pain.    Musculoskeletal: Positive for arthralgias (right hand) and joint swelling (right index finger).  Neurological: Positive for weakness (right hand due to pain).       +tingling to right index finger     Physical Exam Updated Vital Signs BP 137/86   Pulse 79   Temp 98.8 F (37.1 C) (Oral)   Resp 20   Ht 5' 3.5" (1.613 m)   Wt 252 lb (114.3 kg)   SpO2 98%   BMI 43.94 kg/m   Physical Exam  Constitutional: She is oriented to person, place, and time. She appears well-developed and well-nourished. No distress.  HENT:  Head: Normocephalic and atraumatic.  Eyes: EOM are normal.  Neck: Neck supple.  Cardiovascular: Normal rate.   Pulmonary/Chest: Effort normal. No respiratory distress.  Abdominal: She exhibits no distension.  Musculoskeletal: Normal range of motion.       Right hand: She exhibits tenderness and swelling.  Right hand: diffuse tenderness noted throughout dorsum of hand, most significant to right thumb and second finger with swelling noted. Right thumb with increasing pain at flexion of IP joint.  Neurological: She is alert and oriented to person, place, and time.  Skin: Skin is warm and dry.  Psychiatric: She has a normal mood and affect. Her behavior is normal.  Nursing note and vitals reviewed.    ED Treatments / Results  DIAGNOSTIC STUDIES: Oxygen Saturation is 98% on RA, nl by my interpretation.    COORDINATION OF CARE: 10:00 PM Discussed treatment plan with pt at bedside which includes right hand xray, brace, referral and follow up with orthopedist, and pt agreed to plan.   Radiology Dg Hand Complete Right  Result Date: 02/26/2017 CLINICAL DATA:  51 year old female with history of pain in the right hand for 1 month. Intermittent swelling of the right second finger. No history of injury. EXAM: RIGHT HAND - COMPLETE 3+ VIEW COMPARISON:  No priors. FINDINGS: There is no evidence of fracture or dislocation. There is no evidence of arthropathy or other focal  bone abnormality. Soft tissues are unremarkable. IMPRESSION: Negative. Electronically Signed   By: Trudie Reed M.D.   On: 02/26/2017 20:44    Procedures Procedures (including critical care time)  Medications Ordered in ED Medications - No data to display   Initial Impression / Assessment and Plan / ED Course  I have reviewed the triage vital signs and the nursing notes.  Pertinent imaging results that were available during my care of the patient were reviewed by me and considered in my medical decision making (see chart for details).     BP 137/86   Pulse 79   Temp 98.8 F (37.1 C) (Oral)   Resp 20   Ht 5' 3.5" (1.613  m)   Wt 114.3 kg   SpO2 98%   BMI 43.94 kg/m    Final Clinical Impressions(s) / ED Diagnoses   Final diagnoses:  Right hand pain    New Prescriptions Current Discharge Medication List     I personally performed the services described in this documentation, which was scribed in my presence. The recorded information has been reviewed and is accurate.   10:14 PM Pt with recurrent pain to R dominant hand for the past month which has gotten progressively more tender.  Her hand is mildly edematous but no erythema or warmth to suggest infectious etiology or gout.  She does do repetitive movement and her pain follows the median nerve distribution.  Therefore carpal tunnel syndrome is a high likelihood.  She also report weakness, but this is likely 2/2 pain.  She is NVI, her forearm compartment is soft.  No pain to R elbow.  Given her complaint, a velcro wrist plint applied.  Recommend RICE therapy and if no improvement, pt may benefit from seeing hand specialist for further evaluation. Return precaution given.     Fayrene Helper, PA-C 02/26/17 2218    Nira Conn, MD 02/27/17 [phone]

## 2017-02-26 NOTE — ED Notes (Addendum)
Patient c/o right hand pain x1 month, states this morning her index finger & thumb were swollen, no swelling visualized at this time. Patient denies injuries, states she is unable to move those fingers, and pulls away when attempting to touch hand. Patient states she has been taking 2- 81mg  ASA daily for pain, last taken on yesterday. Patient denies any new changers triggering her to come tonight. Family at bedside.

## 2017-02-26 NOTE — ED Triage Notes (Signed)
Pain in her right hand for a month. Her 2nd digit swells on and off. No injury.

## 2018-02-15 ENCOUNTER — Observation Stay (HOSPITAL_BASED_OUTPATIENT_CLINIC_OR_DEPARTMENT_OTHER)
Admission: EM | Admit: 2018-02-15 | Discharge: 2018-02-16 | Disposition: A | Discharge status: Home / Self Care | Payer: Medicare PPO | Attending: Internal Medicine | Admitting: Internal Medicine

## 2018-02-15 ENCOUNTER — Emergency Department (HOSPITAL_BASED_OUTPATIENT_CLINIC_OR_DEPARTMENT_OTHER): Payer: Medicare PPO

## 2018-02-15 ENCOUNTER — Encounter (HOSPITAL_BASED_OUTPATIENT_CLINIC_OR_DEPARTMENT_OTHER): Payer: Self-pay | Admitting: Emergency Medicine

## 2018-02-15 ENCOUNTER — Other Ambulatory Visit: Payer: Self-pay

## 2018-02-15 DIAGNOSIS — Z7901 Long term (current) use of anticoagulants: Secondary | ICD-10-CM | POA: Diagnosis not present

## 2018-02-15 DIAGNOSIS — Z23 Encounter for immunization: Secondary | ICD-10-CM | POA: Insufficient documentation

## 2018-02-15 DIAGNOSIS — E119 Type 2 diabetes mellitus without complications: Secondary | ICD-10-CM | POA: Diagnosis not present

## 2018-02-15 DIAGNOSIS — I2510 Atherosclerotic heart disease of native coronary artery without angina pectoris: Secondary | ICD-10-CM | POA: Insufficient documentation

## 2018-02-15 DIAGNOSIS — E876 Hypokalemia: Secondary | ICD-10-CM | POA: Diagnosis present

## 2018-02-15 DIAGNOSIS — E785 Hyperlipidemia, unspecified: Secondary | ICD-10-CM | POA: Diagnosis not present

## 2018-02-15 DIAGNOSIS — G4733 Obstructive sleep apnea (adult) (pediatric): Secondary | ICD-10-CM

## 2018-02-15 DIAGNOSIS — R079 Chest pain, unspecified: Secondary | ICD-10-CM | POA: Diagnosis present

## 2018-02-15 DIAGNOSIS — R791 Abnormal coagulation profile: Secondary | ICD-10-CM | POA: Diagnosis not present

## 2018-02-15 DIAGNOSIS — Z87891 Personal history of nicotine dependence: Secondary | ICD-10-CM | POA: Diagnosis not present

## 2018-02-15 DIAGNOSIS — R0789 Other chest pain: Secondary | ICD-10-CM | POA: Diagnosis present

## 2018-02-15 DIAGNOSIS — R072 Precordial pain: Secondary | ICD-10-CM | POA: Diagnosis not present

## 2018-02-15 DIAGNOSIS — M75101 Unspecified rotator cuff tear or rupture of right shoulder, not specified as traumatic: Secondary | ICD-10-CM | POA: Diagnosis present

## 2018-02-15 DIAGNOSIS — Z9989 Dependence on other enabling machines and devices: Secondary | ICD-10-CM

## 2018-02-15 DIAGNOSIS — G8929 Other chronic pain: Secondary | ICD-10-CM | POA: Diagnosis present

## 2018-02-15 DIAGNOSIS — R7989 Other specified abnormal findings of blood chemistry: Secondary | ICD-10-CM | POA: Diagnosis present

## 2018-02-15 DIAGNOSIS — Z96643 Presence of artificial hip joint, bilateral: Secondary | ICD-10-CM | POA: Insufficient documentation

## 2018-02-15 DIAGNOSIS — E7800 Pure hypercholesterolemia, unspecified: Secondary | ICD-10-CM | POA: Diagnosis present

## 2018-02-15 LAB — HEPATIC FUNCTION PANEL
ALK PHOS: 79 U/L (ref 38–126)
ALT: 26 U/L (ref 14–54)
AST: 26 U/L (ref 15–41)
Albumin: 3.3 g/dL — ABNORMAL LOW (ref 3.5–5.0)
Total Bilirubin: 0.3 mg/dL (ref 0.3–1.2)
Total Protein: 8 g/dL (ref 6.5–8.1)

## 2018-02-15 LAB — CBC
HEMATOCRIT: 36.9 % (ref 36.0–46.0)
HEMOGLOBIN: 11.8 g/dL — AB (ref 12.0–15.0)
MCH: 28.3 pg (ref 26.0–34.0)
MCHC: 32 g/dL (ref 30.0–36.0)
MCV: 88.5 fL (ref 78.0–100.0)
Platelets: 283 10*3/uL (ref 150–400)
RBC: 4.17 MIL/uL (ref 3.87–5.11)
RDW: 14.6 % (ref 11.5–15.5)
WBC: 10 10*3/uL (ref 4.0–10.5)

## 2018-02-15 LAB — TROPONIN I

## 2018-02-15 LAB — BASIC METABOLIC PANEL
ANION GAP: 8 (ref 5–15)
BUN: 13 mg/dL (ref 6–20)
CHLORIDE: 107 mmol/L (ref 101–111)
CO2: 25 mmol/L (ref 22–32)
Calcium: 8.9 mg/dL (ref 8.9–10.3)
Creatinine, Ser: 1.08 mg/dL — ABNORMAL HIGH (ref 0.44–1.00)
GFR calc non Af Amer: 58 mL/min — ABNORMAL LOW (ref >60)
GLUCOSE: 182 mg/dL — AB (ref 65–99)
POTASSIUM: 3.3 mmol/L — AB (ref 3.5–5.1)
Sodium: 140 mmol/L (ref 135–145)

## 2018-02-15 LAB — D-DIMER, QUANTITATIVE: D-Dimer, Quant: 0.88 ug/mL-FEU — ABNORMAL HIGH (ref 0.00–0.50)

## 2018-02-15 MED ORDER — KETOROLAC TROMETHAMINE 15 MG/ML IJ SOLN
15.0000 mg | Freq: Once | INTRAMUSCULAR | Status: AC
  Administered 2018-02-15: 15 mg via INTRAVENOUS
  Filled 2018-02-15: qty 1

## 2018-02-15 MED ORDER — SODIUM CHLORIDE 0.9 % IV BOLUS (SEPSIS)
500.0000 mL | Freq: Once | INTRAVENOUS | Status: AC
  Administered 2018-02-16: 500 mL via INTRAVENOUS

## 2018-02-15 MED ORDER — MORPHINE SULFATE (PF) 4 MG/ML IV SOLN
4.0000 mg | Freq: Once | INTRAVENOUS | Status: AC
  Administered 2018-02-15: 4 mg via INTRAVENOUS
  Filled 2018-02-15: qty 1

## 2018-02-15 NOTE — ED Triage Notes (Addendum)
Pt presents with right sided  chest pain thru to her right shoulder that started 2 days ago. PT mildly diaphoretic in triage.

## 2018-02-15 NOTE — ED Provider Notes (Signed)
[facility] EMERGENCY DEPARTMENT Provider Note   CSN: [id number] Arrival date & time: 02/15/18  2052     History   Chief Complaint Chief Complaint  Patient presents with  . Chest Pain    HPI Sylvia [name] is a 52 y.o. female.  The history is provided by the patient. No language interpreter was used.  Chest Pain      Sylvia [name] is a 52 y.o. female who presents to the Emergency Department complaining of chest pain. She reports central and right sided chest pain that started two days ago.  Pain is constant in nature, pressure-like sensation.  Pain is better with lying down and worse sitting up. She has associated sharp pain in the back of her shoulder on the right side.  No abdominal pain.  Denies vomiting, fevers, leg swelling or pain.  She does endorse nausea, sob, leg tingling (bilaterally).  Symptoms are severe, constant, worsening.  Past Medical History:  Diagnosis Date  . Arthritis    oa  . Chronic pain   . Coronary artery disease   . Diabetes mellitus without complication (HCC)   . DJD (degenerative joint disease)   . Gastric ulcer yrs ago  . GERD (gastroesophageal reflux disease)    hx of, none recent  . High cholesterol   . Impaired hearing    left ear  . Measles as child  . Mumps as child  . Osteoporosis   . Vertigo     Patient Active Problem List   Diagnosis Date Noted  . Chest pain 02/16/2018  . OA (osteoarthritis) of hip 03/01/2016    Past Surgical History:  Procedure Laterality Date  . ABDOMINAL HYSTERECTOMY     partial  . JOINT REPLACEMENT Right 2014   hip replacement  . L4 to L 5 disc surgery    . TOTAL HIP ARTHROPLASTY Left 03/01/2016   Procedure: LEFT TOTAL HIP ARTHROPLASTY ANTERIOR APPROACH;  Surgeon: Ollen Gross, MD;  Location: [facility]S;  Service: Orthopedics;  Laterality: Left;  . TUBAL LIGATION    . UTERINE FIBROID SURGERY      OB History    No data available       Home Medications    Prior to Admission  medications   Medication Sig Start Date End Date Taking? Authorizing Provider  atorvastatin (LIPITOR) 80 MG tablet Take 80 mg by mouth daily.    [provider]  glipiZIDE (GLUCOTROL XL) 5 MG 24 hr tablet Take 5 mg by mouth daily with breakfast.    [provider]  methocarbamol (ROBAXIN) 500 MG tablet Take 1 tablet (500 mg total) by mouth every 6 (six) hours as needed for muscle spasms. 02/26/17   Fayrene Helper, PA-C  oxyCODONE (OXY IR/ROXICODONE) 5 MG immediate release tablet Take 1-2 tablets (5-10 mg total) by mouth every 3 (three) hours as needed for moderate pain or severe pain. 03/02/16   Perkins, Alexzandrew L, PA-C  rivaroxaban (XARELTO) 10 MG TABS tablet Take 1 tablet (10 mg total) by mouth daily with breakfast. Take Xarelto for two and a half more weeks, then discontinue Xarelto. Once the patient has completed the blood thinner regimen, then take a Baby 81 mg Aspirin daily for three more weeks. 03/02/16   Perkins, Alexzandrew L, PA-C  traMADol (ULTRAM) 50 MG tablet Take 1-2 tablets (50-100 mg total) by mouth every 6 (six) hours as needed for moderate pain. 02/26/17   Fayrene Helper, PA-C    Family History No family history on  file.  Social History Social History   Tobacco Use  . Smoking status: Former Smoker    Types: Cigarettes    Last attempt to quit: 12/19/2015    Years since quitting: 2.1  . Smokeless tobacco: Never Used  Substance Use Topics  . Alcohol use: Yes    Comment: occasional wine  . Drug use: No     Allergies   Contrast media [iodinated diagnostic agents] and Demerol [meperidine]   Review of Systems Review of Systems  Cardiovascular: Positive for chest pain.  All other systems reviewed and are negative.    Physical Exam Updated Vital Signs BP 135/73   Pulse 87   Temp 98 F (36.7 C) (Oral)   Resp (!) 23   Ht 5\' 3"  (1.6 m)   Wt 113.4 kg (250 lb)   SpO2 99%   BMI 44.29 kg/m   Physical Exam  Constitutional: She is oriented to person,  place, and time. She appears well-developed and well-nourished.  HENT:  Head: Normocephalic and atraumatic.  Cardiovascular: Normal rate and regular rhythm.  No murmur heard. Pulmonary/Chest: Effort normal and breath sounds normal. No respiratory distress.  Significant right sided chest wall tenderness  Abdominal: Soft. There is no tenderness. There is no rebound and no guarding.  Musculoskeletal: She exhibits no edema.  2+ radial and DP pulses bilaterally  Neurological: She is alert and oriented to person, place, and time.  Skin: Skin is warm and dry.  Psychiatric: She has a normal mood and affect. Her behavior is normal.  Nursing note and vitals reviewed.    ED Treatments / Results  Labs (all labs ordered are listed, but only abnormal results are displayed) Labs Reviewed  BASIC METABOLIC PANEL - Abnormal; Notable for the following components:      Result Value   Potassium 3.3 (*)    Glucose, Bld 182 (*)    Creatinine, Ser 1.08 (*)    GFR calc non Af Amer 58 (*)    All other components within normal limits  CBC - Abnormal; Notable for the following components:   Hemoglobin 11.8 (*)    All other components within normal limits  HEPATIC FUNCTION PANEL - Abnormal; Notable for the following components:   Albumin 3.3 (*)    Bilirubin, Direct <0.1 (*)    All other components within normal limits  D-DIMER, QUANTITATIVE (NOT AT [facility]) - Abnormal; Notable for the following components:   D-Dimer, Quant 0.88 (*)    All other components within normal limits  TROPONIN I    EKG  EKG Interpretation  Date/Time:  Friday February 15 2018 22:25:20 EST Ventricular Rate:  80 PR Interval:    QRS Duration: 111 QT Interval:  406 QTC Calculation: 469 R Axis:   -3 Text Interpretation:  Sinus rhythm Ventricular premature complex Abnormal R-wave progression, early transition Probable left ventricular hypertrophy Confirmed by Tilden Fossaees, Aretha Levi [id number]) on 02/15/2018 10:27:37 PM       Radiology Dg  Chest 2 View  Result Date: 02/15/2018 CLINICAL DATA:  Chest tightness and short of breath EXAM: CHEST  2 VIEW COMPARISON:  CT 09/06/2015, radiograph 01/16/2014 FINDINGS: Mildly low lung volumes with minimal atelectasis at the left base. No consolidation or effusion. Borderline heart size. No pneumothorax. Degenerative changes of the spine. IMPRESSION: No active cardiopulmonary disease.  Low lung volumes. Electronically Signed   By: Jasmine PangKim  Fujinaga M.D.   On: 02/15/2018 21:47    Procedures Procedures (including critical care time)  Medications Ordered in ED Medications  ketorolac (TORADOL) 15 MG/ML injection 15 mg (15 mg Intravenous Given 02/15/18 2230)  morphine 4 MG/ML injection 4 mg (4 mg Intravenous Given 02/15/18 2312)  sodium chloride 0.9 % bolus 500 mL (500 mLs Intravenous New Bag/Given 02/16/18 0003)     Initial Impression / Assessment and Plan / ED Course  I have reviewed the triage vital signs and the nursing notes.  Pertinent labs & imaging results that were available during my care of the patient were reviewed by me and considered in my medical decision making (see chart for details).     Patient here for evaluation of chest pain, shortness of breath.  She is uncomfortable appearing on examination with significant tenderness to palpation along her right anterior and posterior thorax.  Abdominal examination is benign.  Presentation is not consistent with cholecystitis.  ACS is unlikely given constant symptoms and reproducible nature of her pain.  She only had minimal improvement in her pain following treatment with anti-inflammatories as well as narcotics.  D-dimer is mildly elevated.  Unable to obtain CT PE study given her contrast allergy.  Plan to transfer for observation admission for chest pain and VQ scan.  Patient updated of findings of studies and she is in agreement with plan.  Final Clinical Impressions(s) / ED Diagnoses   Final diagnoses:  Precordial pain    ED Discharge  Orders    None       Tilden Fossa, MD 02/16/18 [phone]

## 2018-02-16 ENCOUNTER — Observation Stay (HOSPITAL_BASED_OUTPATIENT_CLINIC_OR_DEPARTMENT_OTHER): Payer: Medicare PPO

## 2018-02-16 ENCOUNTER — Observation Stay (HOSPITAL_COMMUNITY): Payer: Medicare PPO

## 2018-02-16 ENCOUNTER — Encounter (HOSPITAL_COMMUNITY): Payer: Self-pay | Admitting: Family Medicine

## 2018-02-16 DIAGNOSIS — R072 Precordial pain: Secondary | ICD-10-CM

## 2018-02-16 DIAGNOSIS — Z9989 Dependence on other enabling machines and devices: Secondary | ICD-10-CM

## 2018-02-16 DIAGNOSIS — R7989 Other specified abnormal findings of blood chemistry: Secondary | ICD-10-CM | POA: Diagnosis present

## 2018-02-16 DIAGNOSIS — M75101 Unspecified rotator cuff tear or rupture of right shoulder, not specified as traumatic: Secondary | ICD-10-CM | POA: Diagnosis not present

## 2018-02-16 DIAGNOSIS — R079 Chest pain, unspecified: Secondary | ICD-10-CM | POA: Diagnosis not present

## 2018-02-16 DIAGNOSIS — G4733 Obstructive sleep apnea (adult) (pediatric): Secondary | ICD-10-CM

## 2018-02-16 DIAGNOSIS — E7800 Pure hypercholesterolemia, unspecified: Secondary | ICD-10-CM

## 2018-02-16 DIAGNOSIS — E785 Hyperlipidemia, unspecified: Secondary | ICD-10-CM | POA: Diagnosis not present

## 2018-02-16 DIAGNOSIS — E876 Hypokalemia: Secondary | ICD-10-CM

## 2018-02-16 DIAGNOSIS — G8929 Other chronic pain: Secondary | ICD-10-CM | POA: Diagnosis present

## 2018-02-16 LAB — GLUCOSE, CAPILLARY
GLUCOSE-CAPILLARY: 128 mg/dL — AB (ref 65–99)
Glucose-Capillary: 176 mg/dL — ABNORMAL HIGH (ref 65–99)
Glucose-Capillary: 121 mg/dL — ABNORMAL HIGH (ref 65–99)

## 2018-02-16 LAB — BASIC METABOLIC PANEL
Anion gap: 9 (ref 5–15)
BUN: 12 mg/dL (ref 6–20)
CHLORIDE: 108 mmol/L (ref 101–111)
CO2: 22 mmol/L (ref 22–32)
CREATININE: 0.95 mg/dL (ref 0.44–1.00)
Calcium: 8.7 mg/dL — ABNORMAL LOW (ref 8.9–10.3)
GFR calc Af Amer: >60 mL/min (ref >60)
GFR calc non Af Amer: >60 mL/min (ref >60)
GLUCOSE: 181 mg/dL — AB (ref 65–99)
POTASSIUM: 4 mmol/L (ref 3.5–5.1)
SODIUM: 139 mmol/L (ref 135–145)

## 2018-02-16 LAB — TROPONIN I
Troponin I: <0.03 ng/mL (ref <0.03)
Troponin I: <0.03 ng/mL (ref <0.03)

## 2018-02-16 LAB — HIV ANTIBODY (ROUTINE TESTING W REFLEX): HIV SCREEN 4TH GENERATION: NONREACTIVE

## 2018-02-16 LAB — MAGNESIUM: Magnesium: 2.2 mg/dL (ref 1.7–2.4)

## 2018-02-16 MED ORDER — SODIUM CHLORIDE 0.9 % IV SOLN
INTRAVENOUS | Status: DC
  Administered 2018-02-16: 11:00:00 via INTRAVENOUS

## 2018-02-16 MED ORDER — TECHNETIUM TC 99M DIETHYLENETRIAME-PENTAACETIC ACID
31.0000 | Freq: Once | INTRAVENOUS | Status: AC | PRN
  Administered 2018-02-16: 31 via RESPIRATORY_TRACT

## 2018-02-16 MED ORDER — ALPRAZOLAM 0.25 MG PO TABS: 0.2500 mg | ORAL_TABLET | Freq: Two times a day (BID) | ORAL | Status: DC | PRN

## 2018-02-16 MED ORDER — OXYCODONE HCL 5 MG PO TABS: 5.0000 mg | ORAL_TABLET | ORAL | Status: DC | PRN

## 2018-02-16 MED ORDER — POTASSIUM CHLORIDE CRYS ER 20 MEQ PO TBCR
20.0000 meq | EXTENDED_RELEASE_TABLET | Freq: Once | ORAL | Status: AC
  Administered 2018-02-16: 20 meq via ORAL
  Filled 2018-02-16: qty 1

## 2018-02-16 MED ORDER — ASPIRIN 81 MG PO CHEW
324.0000 mg | CHEWABLE_TABLET | ORAL | Status: AC
  Administered 2018-02-16: 324 mg via ORAL
  Filled 2018-02-16: qty 4

## 2018-02-16 MED ORDER — PNEUMOCOCCAL VAC POLYVALENT 25 MCG/0.5ML IJ INJ
0.5000 mL | INJECTION | INTRAMUSCULAR | Status: AC
  Administered 2018-02-16: 0.5 mL via INTRAMUSCULAR

## 2018-02-16 MED ORDER — METHOCARBAMOL 500 MG PO TABS: 500.0000 mg | ORAL_TABLET | Freq: Four times a day (QID) | ORAL | Status: DC | PRN

## 2018-02-16 MED ORDER — GI COCKTAIL ~~LOC~~
30.0000 mL | Freq: Once | ORAL | Status: AC
  Administered 2018-02-16: 30 mL via ORAL
  Filled 2018-02-16: qty 30

## 2018-02-16 MED ORDER — TECHNETIUM TO 99M ALBUMIN AGGREGATED
4.1200 | Freq: Once | INTRAVENOUS | Status: AC | PRN
  Administered 2018-02-16: 4.12 via INTRAVENOUS

## 2018-02-16 MED ORDER — ATORVASTATIN CALCIUM 80 MG PO TABS: 80.0000 mg | ORAL_TABLET | Freq: Every day | ORAL | Status: DC

## 2018-02-16 MED ORDER — ACETAMINOPHEN 325 MG PO TABS: 650.0000 mg | ORAL_TABLET | ORAL | Status: DC | PRN

## 2018-02-16 MED ORDER — ONDANSETRON HCL 4 MG/2ML IJ SOLN: 4.0000 mg | Freq: Four times a day (QID) | INTRAMUSCULAR | Status: DC | PRN

## 2018-02-16 MED ORDER — INSULIN ASPART 100 UNIT/ML ~~LOC~~ SOLN: 0.0000 [IU] | SUBCUTANEOUS | Status: DC

## 2018-02-16 MED ORDER — MORPHINE SULFATE (PF) 4 MG/ML IV SOLN: 4.0000 mg | INTRAVENOUS | Status: DC | PRN

## 2018-02-16 MED ORDER — ASPIRIN EC 81 MG PO TBEC: 81.0000 mg | DELAYED_RELEASE_TABLET | Freq: Every day | ORAL | Status: DC

## 2018-02-16 MED ORDER — ASPIRIN 300 MG RE SUPP: 300.0000 mg | RECTAL | Status: AC

## 2018-02-16 MED ORDER — NITROGLYCERIN 0.4 MG SL SUBL
0.4000 mg | SUBLINGUAL_TABLET | SUBLINGUAL | Status: DC | PRN
Start: 2018-02-16 — End: 2018-02-16

## 2018-02-16 MED ORDER — ACETAMINOPHEN 325 MG PO TABS: 650.0000 mg | ORAL_TABLET | ORAL | Status: AC | PRN

## 2018-02-16 MED ORDER — ENOXAPARIN SODIUM 40 MG/0.4ML ~~LOC~~ SOLN
40.0000 mg | SUBCUTANEOUS | Status: DC
  Administered 2018-02-16: 40 mg via SUBCUTANEOUS
  Filled 2018-02-16: qty 0.4

## 2018-02-16 NOTE — Progress Notes (Signed)
VASCULAR LAB PRELIMINARY  PRELIMINARY  PRELIMINARY  PRELIMINARY  Bilateral lower extremity completed.    Preliminary report:  There is no DVT or SVT noted in the bilateral lower extremities.   Erby Sanderson, RVT 02/16/2018, 12:53 PM

## 2018-02-16 NOTE — Progress Notes (Signed)
[facility] PROGRESS NOTE  Boykin NearingLisa A Haverstick ZOX:[id number] DOB: 09/25/1966 DOA: 02/15/2018 PCP: Murrell ConverseShelburne, David M  Brief summary   52 y.o. female with medical history significant for hyperlipidemia, type 2 diabetes mellitus, and chronic pain related to nontraumatic right rotator cuff tear, now presenting to the emergency department for evaluation of chest pain.  Patient reports that she been in her usual state of health until 2-3 days ago when she developed pain in the right upper chest.  She describes this as constant, occasionally radiating through to the right shoulder blade and right upper arm, waxing and waning, worse with certain movements, worse with talking, and worse with cough.  She reports mild nausea and dyspnea as well.  No fevers or chills.  Reports that the left leg had been swollen, but now resolved.  Reports intermittent tenderness in the lower right leg.  Denies personal or family history of VTE.   [facility] ED Course: Upon arrival to the ED, patient is found to be afebrile, saturating well on room air, a sinus rhythm with PVC and early R transition.  Chest x-ray is negative for acute cardiopulmonary disease.  Troponin is undetectable.  D-dimer is elevated to 0.88.   Assessment/Plan:  Right sided chest pain. Atypical. Possible  musculoskeletal pain due to right shoulder.stenderness on palpation in the right shoulder and chest area. Troponins: negative. No recurrence of chest pains.  -positive d-dimer. VQ is pend. Check doppler legs   Hypokalemia. Serum potassium is 3.2 on admission. Replaced.   Hyperlipidemia. Continue Lipitor    Type II DM. A1c was 6.9% in 2017.  Managed at home with glipizide, held on admission. Monitor on ISS    Code Status: full Family Communication: d/w patient, RN (indicate person spoken with, relationship, and if by phone, the number) Disposition Plan: home pend improvement    Consultants:  none  Procedures:  VQ scan    Antibiotics:  none (indicate start date, and stop date if known)  HPI/Subjective: Alert, reports right shoulder pain. No acute chest pains   Objective: Vitals:   02/16/18 0316 02/16/18 0850  BP:  122/78  Pulse:  77  Resp:  16  Temp:  97.8 F (36.6 C)  SpO2: 95% 95%   No intake or output data in the 24 hours ending 02/16/18 0905 Filed Weights   02/15/18 2104 02/16/18 0206  Weight: 113.4 kg (250 lb) 118 kg (260 lb 2.3 oz)    Exam:   General:  No distress, alert  Cardiovascular: s1,s2 rrr  Respiratory: CTA BL  Abdomen: soft, obese, nt  Musculoskeletal: no leg edema    Data Reviewed: Basic Metabolic Panel: Recent Labs  Lab 02/15/18 2120 02/16/18 0355  NA 140 139  K 3.3* 4.0  CL 107 108  CO2 25 22  GLUCOSE 182* 181*  BUN 13 12  CREATININE 1.08* 0.95  CALCIUM 8.9 8.7*  MG  --  2.2   Liver Function Tests: Recent Labs  Lab 02/15/18 2120  AST 26  ALT 26  ALKPHOS 79  BILITOT 0.3  PROT 8.0  ALBUMIN 3.3*   No results for input(s): LIPASE, AMYLASE in the last 168 hours. No results for input(s): AMMONIA in the last 168 hours. CBC: Recent Labs  Lab 02/15/18 2120  WBC 10.0  HGB 11.8*  HCT 36.9  MCV 88.5  PLT 283   Cardiac Enzymes: Recent Labs  Lab 02/15/18 2120 02/16/18 0355  TROPONINI <0.03 <0.03   BNP (last 3 results) No results for  input(s): BNP in the last 8760 hours.  ProBNP (last 3 results) No results for input(s): PROBNP in the last 8760 hours.  CBG: Recent Labs  Lab 02/16/18 0425 02/16/18 0845  GLUCAP 176* 121*    No results found for this or any previous visit (from the past 240 hour(s)).   Studies: Dg Chest 2 View  Result Date: 02/15/2018 CLINICAL DATA:  Chest tightness and short of breath EXAM: CHEST  2 VIEW COMPARISON:  CT 09/06/2015, radiograph 01/16/2014 FINDINGS: Mildly low lung volumes with minimal atelectasis at the left base. No consolidation or effusion. Borderline heart size. No pneumothorax. Degenerative  changes of the spine. IMPRESSION: No active cardiopulmonary disease.  Low lung volumes. Electronically Signed   By: Jasmine Pang M.D.   On: 02/15/2018 21:47    Scheduled Meds: . [START ON 02/17/2018] aspirin EC  81 mg Oral Daily  . atorvastatin  80 mg Oral q1800  . enoxaparin (LOVENOX) injection  40 mg Subcutaneous Q24H  . insulin aspart  0-9 Units Subcutaneous Q4H  . [START ON 02/17/2018] pneumococcal 23 valent vaccine  0.5 mL Intramuscular Tomorrow-1000   Continuous Infusions:  Principal Problem:   Chest pain Active Problems:   Chronic pain   High cholesterol   OSA on CPAP   Right rotator cuff tear   Hypokalemia   Positive D dimer    Time spent: >35 minutes     Esperanza Sheets  [facility] Pager [phone]. If 7PM-7AM, please contact night-coverage at [email or web address], password [facility] 02/16/2018, 9:05 AM  LOS: 0 days

## 2018-02-16 NOTE — Discharge Summary (Signed)
Physician Discharge Summary  Wilmer FloorLisa A Chipley ZOX:[id number] DOB: 12/01/1966 DOA: 02/15/2018  PCP: Murrell ConverseShelburne, David M  Admit date: 02/15/2018 Discharge date: 02/16/2018  Time spent: >35 minutes  Recommendations for Outpatient Follow-up:  F/u with PCP in 3-7 days  F/u with orthopedics in 1-2 weeks   Discharge Diagnoses:  Principal Problem:   Chest pain Active Problems:   Chronic pain   High cholesterol   OSA on CPAP   Right rotator cuff tear   Hypokalemia   Positive D dimer   Discharge Condition: stable   Diet recommendation: carb modified.   Filed Weights   02/15/18 2104 02/16/18 0206  Weight: 113.4 kg (250 lb) 118 kg (260 lb 2.3 oz)    History of present illness:   52 y.o.femalewith medical history significant forhyperlipidemia, type 2 diabetes mellitus, and chronic pain related to nontraumatic right rotator cuff tear, now presenting to the emergency department for evaluation of chest pain. Patient reports that she been in her usual state of health until 2-3 days ago when she developed pain in the right upper chest. She describes this as constant, occasionally radiating through to the right shoulder blade and right upper arm, waxing and waning, worse with certain movements, worse with talking, and worse with cough. She reports mild nausea and dyspnea as well. No fevers or chills. Reports that the left leg had been swollen, but now resolved. Reports intermittent tenderness in the lower right leg. Denies personal or family history of VTE.   [facility], patient is found to be afebrile, saturating well on room air, a sinus rhythm with PVC and early R transition. Chest x-ray is negative for acute cardiopulmonary disease. Troponin is undetectable. D-dimer is elevated to 0.88.     Hospital Course:   Right sided chest pain. Possible  musculoskeletal pain due to right shoulder. tenderness on palpation in the right shoulder and  chest area. Troponins: negative. No recurrence of chest pains.  -mild positive d dimer.  VQ scan is negative. doppler legs: prelim negative   Right shoulder pain. Possible rotator injury. No decreased in ROM. Recommended  to f/u with orthopedic as outpatient in 1-2 weeks  Hypokalemia. Serum potassium is 3.2 on admission. Replaced.   Hyperlipidemia.Continue Lipitor   Type II DM. A1c was 6.9% in 2017. Managed at home with glipizide   Procedures:  VQ scan: neg  (i.e. Studies not automatically included, echos, thoracentesis, etc; not x-rays)  Consultations:  none  Discharge Exam: Vitals:   02/16/18 0316 02/16/18 0850  BP:  122/78  Pulse:  77  Resp:  16  Temp:  97.8 F (36.6 C)  SpO2: 95% 95%    General: alert. No distress  Cardiovascular: s1,s2 rrr Respiratory: CTA BL  Discharge Instructions  Discharge Instructions    Diet - low sodium heart healthy   Complete by:  As directed    Increase activity slowly   Complete by:  As directed      Allergies as of 02/16/2018      Reactions   Contrast Media [iodinated Diagnostic Agents] Other (See Comments)   Convulsions   Demerol [meperidine] Nausea And Vomiting   Shaking    Metformin And Related Other (See Comments)   "intense pain in my hands"      Medication List    STOP taking these medications   methocarbamol 500 MG tablet Commonly known as:  ROBAXIN   oxyCODONE 5 MG immediate release tablet Commonly known as:  Oxy  IR/ROXICODONE   traMADol 50 MG tablet Commonly known as:  ULTRAM     TAKE these medications   acetaminophen 325 MG tablet Commonly known as:  TYLENOL Take 2 tablets (650 mg total) by mouth every 4 (four) hours as needed for headache or mild pain.   aspirin EC 81 MG tablet Take 81 mg by mouth daily.   atorvastatin 80 MG tablet Commonly known as:  LIPITOR Take 80 mg by mouth daily.   glipiZIDE 5 MG 24 hr tablet Commonly known as:  GLUCOTROL XL Take 5 mg by mouth daily with  breakfast.      Allergies  Allergen Reactions  . Contrast Media [Iodinated Diagnostic Agents] Other (See Comments)    Convulsions  . Demerol [Meperidine] Nausea And Vomiting    Shaking   . Metformin And Related Other (See Comments)    "intense pain in my hands"      The results of significant diagnostics from this hospitalization (including imaging, microbiology, ancillary and laboratory) are listed below for reference.    Significant Diagnostic Studies: Dg Chest 2 View  Result Date: 02/15/2018 CLINICAL DATA:  Chest tightness and short of breath EXAM: CHEST  2 VIEW COMPARISON:  CT 09/06/2015, radiograph 01/16/2014 FINDINGS: Mildly low lung volumes with minimal atelectasis at the left base. No consolidation or effusion. Borderline heart size. No pneumothorax. Degenerative changes of the spine. IMPRESSION: No active cardiopulmonary disease.  Low lung volumes. Electronically Signed   By: Jasmine Pang M.D.   On: 02/15/2018 21:47   Nm Pulmonary Perf And Vent  Result Date: 02/16/2018 CLINICAL DATA:  Elevated D-dimer. Intermittent bilateral lower extremity edema. Evaluate for pulmonary embolism. EXAM: NUCLEAR MEDICINE VENTILATION - PERFUSION LUNG SCAN TECHNIQUE: Ventilation images were obtained in multiple projections using inhaled aerosol Tc-10m DTPA. Perfusion images were obtained in multiple projections after intravenous injection of Tc-51m-MAA. RADIOPHARMACEUTICALS:  31 mCi of Tc-54m DTPA aerosol inhalation and 4.2 mCi Tc28m-MAA IV COMPARISON:  Chest radiograph - 02/15/2018; 01/16/2014 FINDINGS: Review of chest radiograph performed 02/15/2018 demonstrates grossly unchanged cardiac silhouette and mediastinal contours. Veiling opacities overlying the bilateral lower lungs are favored to represent overlying breast tissues. No discrete focal airspace opacities. No evidence of edema. Ventilation: There is homogeneous distribution of inhaled radiotracer throughout the bilateral pulmonary parenchyma.  No discrete areas of non ventilation. Ingested radiotracer is seen within the mouth, hypopharynx and stomach. Perfusion: There is homogeneous distribution of injected radiotracer throughout the bilateral pulmonary parenchyma without discrete segmental mismatched filling defect to suggest pulmonary embolism. IMPRESSION: Pulmonary embolism absent (very low probability of pulmonary embolism). Electronically Signed   By: Simonne Come M.D.   On: 02/16/2018 13:13    Microbiology: No results found for this or any previous visit (from the past 240 hour(s)).   Labs: Basic Metabolic Panel: Recent Labs  Lab 02/15/18 2120 02/16/18 0355  NA 140 139  K 3.3* 4.0  CL 107 108  CO2 25 22  GLUCOSE 182* 181*  BUN 13 12  CREATININE 1.08* 0.95  CALCIUM 8.9 8.7*  MG  --  2.2   Liver Function Tests: Recent Labs  Lab 02/15/18 2120  AST 26  ALT 26  ALKPHOS 79  BILITOT 0.3  PROT 8.0  ALBUMIN 3.3*   No results for input(s): LIPASE, AMYLASE in the last 168 hours. No results for input(s): AMMONIA in the last 168 hours. CBC: Recent Labs  Lab 02/15/18 2120  WBC 10.0  HGB 11.8*  HCT 36.9  MCV 88.5  PLT 283  Cardiac Enzymes: Recent Labs  Lab 02/15/18 2120 02/16/18 0355 02/16/18 0935  TROPONINI <0.03 <0.03 <0.03   BNP: BNP (last 3 results) No results for input(s): BNP in the last 8760 hours.  ProBNP (last 3 results) No results for input(s): PROBNP in the last 8760 hours.  CBG: Recent Labs  Lab 02/16/18 0425 02/16/18 0845 02/16/18 1312  GLUCAP 176* 121* 128*       Signed:  Jonette Mate N  [facility] 02/16/2018, 3:55 PM

## 2018-02-16 NOTE — H&P (Addendum)
History and Physical    Sylvia [name]:[id number] DOB: 04-Nov-1966 DOA: 02/15/2018  PCP: Murrell Converse   Patient coming from: Home, by way of [facility]   Chief Complaint: Chest pain   HPI: Sylvia [name] is a 52 y.o. female with medical history significant for hyperlipidemia, type 2 diabetes mellitus, and chronic pain related to nontraumatic right rotator cuff tear, now presenting to the emergency department for evaluation of chest pain.  Patient reports that she been in her usual state of health until 2-3 days ago when she developed pain in the right upper chest.  She describes this as constant, occasionally radiating through to the right shoulder blade and right upper arm, waxing and waning, worse with certain movements, worse with talking, and worse with cough.  She reports mild nausea and dyspnea as well.  No fevers or chills.  Reports that the left leg had been swollen, but now resolved.  Reports intermittent tenderness in the lower right leg.  Denies personal or family history of VTE.   [facility] ED Course: Upon arrival to the ED, patient is found to be afebrile, saturating well on room air, a sinus rhythm with PVC and early R transition.  Chest x-ray is negative for acute cardiopulmonary disease.  Troponin is undetectable.  D-dimer is elevated to 0.88.  Chemistry panel is notable for mild hypokalemia and creatinine of 1.08.  CBC is unremarkable.  Patient was treated with GI cocktail, Toradol, morphine, and 500 cc normal saline in the ED.  Pain improved with morphine.  She remains hemodynamically stable, in no apparent respiratory distress, and will be observed in the stepdown unit for ongoing evaluation and management of chest pain, atypical for ACS.  Review of Systems:  All other systems reviewed and apart from HPI, are negative.  Past Medical History:  Diagnosis Date  . Arthritis    oa  . Chronic pain   . Coronary artery disease   . Diabetes mellitus without  complication (HCC)   . DJD (degenerative joint disease)   . Gastric ulcer yrs ago  . GERD (gastroesophageal reflux disease)    hx of, none recent  . High cholesterol   . Impaired hearing    left ear  . Measles as child  . Mumps as child  . Osteoporosis   . Vertigo     Past Surgical History:  Procedure Laterality Date  . ABDOMINAL HYSTERECTOMY     partial  . JOINT REPLACEMENT Right 2014   hip replacement  . L4 to L 5 disc surgery    . TOTAL HIP ARTHROPLASTY Left 03/01/2016   Procedure: LEFT TOTAL HIP ARTHROPLASTY ANTERIOR APPROACH;  Surgeon: Ollen Gross, MD;  Location: [facility];  Service: Orthopedics;  Laterality: Left;  . TUBAL LIGATION    . UTERINE FIBROID SURGERY       reports that she quit smoking about 2 years ago. Her smoking use included cigarettes. she has never used smokeless tobacco. She reports that she drinks alcohol. She reports that she does not use drugs.  Allergies  Allergen Reactions  . Contrast Media [Iodinated Diagnostic Agents] Other (See Comments)    Convulsions  . Demerol [Meperidine] Nausea And Vomiting    Shaking     Family History  Problem Relation Age of Onset  . Pulmonary embolism Neg Hx   . Deep vein thrombosis Neg Hx      Prior to Admission medications   Medication Sig Start Date End Date Taking? Authorizing Provider  atorvastatin (LIPITOR) 80 MG tablet Take 80 mg by mouth daily.    [provider]  glipiZIDE (GLUCOTROL XL) 5 MG 24 hr tablet Take 5 mg by mouth daily with breakfast.    [provider]  methocarbamol (ROBAXIN) 500 MG tablet Take 1 tablet (500 mg total) by mouth every 6 (six) hours as needed for muscle spasms. 02/26/17   Fayrene Helper, PA-C  oxyCODONE (OXY IR/ROXICODONE) 5 MG immediate release tablet Take 1-2 tablets (5-10 mg total) by mouth every 3 (three) hours as needed for moderate pain or severe pain. 03/02/16   Perkins, Alexzandrew L, PA-C  traMADol (ULTRAM) 50 MG tablet Take 1-2 tablets (50-100 mg total)  by mouth every 6 (six) hours as needed for moderate pain. 02/26/17   Fayrene Helper, PA-C    Physical Exam: Vitals:   02/15/18 2134 02/15/18 2330 02/16/18 0206 02/16/18 0316  BP:  114/71 132/78   Pulse:  79 79   Resp:  20 20   Temp:   97.7 F (36.5 C)   TempSrc:   Oral   SpO2: 99% 96% 95% 95%  Weight:   118 kg (260 lb 2.3 oz)   Height:   5\' 3"  (1.6 m)       Constitutional: NAD, calm, obese  Eyes: PERTLA, lids and conjunctivae normal ENMT: Mucous membranes are moist. Posterior pharynx clear of any exudate or lesions.   Neck: normal, supple, no masses, no thyromegaly Respiratory: clear to auscultation bilaterally, no wheezing, no crackles. Normal respiratory effort.  Cardiovascular: S1 & S2 heard, regular rate and rhythm. No significant JVD. Abdomen: No distension, no tenderness, no masses palpated. Bowel sounds normal.  Musculoskeletal: Tender in right upper chest. Pain with right shoulder flexion and abduction. No joint deformity upper and lower extremities.   Skin: no significant rashes, lesions, ulcers. Warm, dry, well-perfused. Neurologic: CN 2-12 grossly intact. Sensation intact. Strength 5/5 in all 4 limbs.  Psychiatric: Alert and oriented x 3. Calm. Cooperative.     Labs on Admission: I have personally reviewed following labs and imaging studies  CBC: Recent Labs  Lab 02/15/18 2120  WBC 10.0  HGB 11.8*  HCT 36.9  MCV 88.5  PLT 283   Basic Metabolic Panel: Recent Labs  Lab 02/15/18 2120  NA 140  K 3.3*  CL 107  CO2 25  GLUCOSE 182*  BUN 13  CREATININE 1.08*  CALCIUM 8.9   GFR: Estimated Creatinine Clearance: 74.7 mL/min (A) (by C-G formula based on SCr of 1.08 mg/dL (H)). Liver Function Tests: Recent Labs  Lab 02/15/18 2120  AST 26  ALT 26  ALKPHOS 79  BILITOT 0.3  PROT 8.0  ALBUMIN 3.3*   No results for input(s): LIPASE, AMYLASE in the last 168 hours. No results for input(s): AMMONIA in the last 168 hours. Coagulation Profile: No results for  input(s): INR, PROTIME in the last 168 hours. Cardiac Enzymes: Recent Labs  Lab 02/15/18 2120  TROPONINI <0.03   BNP (last 3 results) No results for input(s): PROBNP in the last 8760 hours. HbA1C: No results for input(s): HGBA1C in the last 72 hours. CBG: No results for input(s): GLUCAP in the last 168 hours. Lipid Profile: No results for input(s): CHOL, HDL, LDLCALC, TRIG, CHOLHDL, LDLDIRECT in the last 72 hours. Thyroid Function Tests: No results for input(s): TSH, T4TOTAL, FREET4, T3FREE, THYROIDAB in the last 72 hours. Anemia Panel: No results for input(s): VITAMINB12, FOLATE, FERRITIN, TIBC, IRON, RETICCTPCT in the last 72 hours. Urine analysis:  Component Value Date/Time   COLORURINE YELLOW 02/23/2016 1414   APPEARANCEUR CLEAR 02/23/2016 1414   LABSPEC 1.015 02/23/2016 1414   PHURINE 5.5 02/23/2016 1414   GLUCOSEU NEGATIVE 02/23/2016 1414   HGBUR NEGATIVE 02/23/2016 1414   BILIRUBINUR NEGATIVE 02/23/2016 1414   KETONESUR NEGATIVE 02/23/2016 1414   PROTEINUR NEGATIVE 02/23/2016 1414   NITRITE NEGATIVE 02/23/2016 1414   LEUKOCYTESUR NEGATIVE 02/23/2016 1414   Sepsis Labs: @LABRCNTIP (procalcitonin:4,lacticidven:4) )No results found for this or any previous visit (from the past 240 hour(s)).   Radiological Exams on Admission: Dg Chest 2 View  Result Date: 02/15/2018 CLINICAL DATA:  Chest tightness and short of breath EXAM: CHEST  2 VIEW COMPARISON:  CT 09/06/2015, radiograph 01/16/2014 FINDINGS: Mildly low lung volumes with minimal atelectasis at the left base. No consolidation or effusion. Borderline heart size. No pneumothorax. Degenerative changes of the spine. IMPRESSION: No active cardiopulmonary disease.  Low lung volumes. Electronically Signed   By: Jasmine PangKim  Fujinaga M.D.   On: 02/15/2018 21:47    EKG: Independently reviewed. Sinus rhythm, PVC, early R-transition.   Assessment/Plan  1. Chest pain; positive d-dimer  - Presents with 2-3 days of constant  reproducible pain in right upper chest - Atypical for ACS, PE considered in light of elevated d-dimer, most likely musculoskeletal and possibly related to known right rotator cuff tendinopathy   - Continue cardiac monitoring, rule-out MI with serial troponin, VQ scan for elevated d-dimer with hx of life-threatening contrast allergy  - Continue Lipitor, ASA, prn analgesia  2. Hypokalemia  - Serum potassium is 3.2 on admission  - Treat with 20 mEq oral potassium  - Repeat chem panel in am   3. Hyperlipidemia - Continue Lipitor    4. Type II DM  - A1c was 6.9% in 2017  - Managed at home with glipizide, held on admission  - Check CBG's and start SSI with Novolog    DVT prophylaxis: Lovenox Code Status: Full  Family Communication: Discussed with patient Disposition Plan: Observe in SDU Consults called: None Admission status: Observation     Briscoe Deutscherimothy S Tacara Hadlock, MD [facility] Pager [phone]  If 7PM-7AM, please contact night-coverage [email or web address] Password [facility] [email or web address]  02/16/2018, 3:58 AM

## 2021-09-16 ENCOUNTER — Other Ambulatory Visit: Payer: Self-pay

## 2021-09-16 ENCOUNTER — Encounter (HOSPITAL_BASED_OUTPATIENT_CLINIC_OR_DEPARTMENT_OTHER): Payer: Self-pay | Admitting: *Deleted

## 2021-09-16 ENCOUNTER — Emergency Department (HOSPITAL_BASED_OUTPATIENT_CLINIC_OR_DEPARTMENT_OTHER): Payer: 59

## 2021-09-16 ENCOUNTER — Emergency Department (HOSPITAL_BASED_OUTPATIENT_CLINIC_OR_DEPARTMENT_OTHER)
Admission: EM | Admit: 2021-09-16 | Discharge: 2021-09-16 | Disposition: A | Discharge status: Home / Self Care | Payer: 59 | Attending: Emergency Medicine | Admitting: Emergency Medicine

## 2021-09-16 DIAGNOSIS — R1084 Generalized abdominal pain: Secondary | ICD-10-CM | POA: Insufficient documentation

## 2021-09-16 DIAGNOSIS — I2510 Atherosclerotic heart disease of native coronary artery without angina pectoris: Secondary | ICD-10-CM | POA: Diagnosis not present

## 2021-09-16 DIAGNOSIS — Z96642 Presence of left artificial hip joint: Secondary | ICD-10-CM | POA: Diagnosis not present

## 2021-09-16 DIAGNOSIS — E119 Type 2 diabetes mellitus without complications: Secondary | ICD-10-CM | POA: Diagnosis not present

## 2021-09-16 DIAGNOSIS — R519 Headache, unspecified: Secondary | ICD-10-CM | POA: Diagnosis not present

## 2021-09-16 DIAGNOSIS — Z79899 Other long term (current) drug therapy: Secondary | ICD-10-CM | POA: Diagnosis not present

## 2021-09-16 DIAGNOSIS — K219 Gastro-esophageal reflux disease without esophagitis: Secondary | ICD-10-CM | POA: Diagnosis not present

## 2021-09-16 DIAGNOSIS — Z87891 Personal history of nicotine dependence: Secondary | ICD-10-CM | POA: Insufficient documentation

## 2021-09-16 DIAGNOSIS — M7918 Myalgia, other site: Secondary | ICD-10-CM | POA: Insufficient documentation

## 2021-09-16 DIAGNOSIS — Z7982 Long term (current) use of aspirin: Secondary | ICD-10-CM | POA: Insufficient documentation

## 2021-09-16 DIAGNOSIS — R42 Dizziness and giddiness: Secondary | ICD-10-CM | POA: Insufficient documentation

## 2021-09-16 LAB — COMPREHENSIVE METABOLIC PANEL
ALT: 45 U/L — ABNORMAL HIGH (ref 0–44)
AST: 41 U/L (ref 15–41)
Albumin: 3.5 g/dL (ref 3.5–5.0)
Alkaline Phosphatase: 63 U/L (ref 38–126)
Anion gap: 6 (ref 5–15)
BUN: 14 mg/dL (ref 6–20)
CO2: 29 mmol/L (ref 22–32)
Calcium: 8.7 mg/dL — ABNORMAL LOW (ref 8.9–10.3)
Chloride: 107 mmol/L (ref 98–111)
Creatinine, Ser: 1.01 mg/dL — ABNORMAL HIGH (ref 0.44–1.00)
GFR, Estimated: >60 mL/min (ref >60)
Glucose, Bld: 99 mg/dL (ref 70–99)
Potassium: 3.5 mmol/L (ref 3.5–5.1)
Sodium: 142 mmol/L (ref 135–145)
Total Bilirubin: 0.3 mg/dL (ref 0.3–1.2)
Total Protein: 8.1 g/dL (ref 6.5–8.1)

## 2021-09-16 LAB — URINALYSIS, ROUTINE W REFLEX MICROSCOPIC
Bilirubin Urine: NEGATIVE
Glucose, UA: NEGATIVE mg/dL
Ketones, ur: NEGATIVE mg/dL
Leukocytes,Ua: NEGATIVE
Nitrite: NEGATIVE
Protein, ur: NEGATIVE mg/dL
Specific Gravity, Urine: 1.03 (ref 1.005–1.030)
pH: 5.5 (ref 5.0–8.0)

## 2021-09-16 LAB — CBC
HCT: 38.7 % (ref 36.0–46.0)
Hemoglobin: 12.4 g/dL (ref 12.0–15.0)
MCH: 29.2 pg (ref 26.0–34.0)
MCHC: 32 g/dL (ref 30.0–36.0)
MCV: 91.1 fL (ref 80.0–100.0)
Platelets: 224 10*3/uL (ref 150–400)
RBC: 4.25 MIL/uL (ref 3.87–5.11)
RDW: 14.1 % (ref 11.5–15.5)
WBC: 4.2 10*3/uL (ref 4.0–10.5)
nRBC: 0 % (ref 0.0–0.2)

## 2021-09-16 LAB — URINALYSIS, MICROSCOPIC (REFLEX)

## 2021-09-16 LAB — LIPASE, BLOOD: Lipase: 103 U/L — ABNORMAL HIGH (ref 11–51)

## 2021-09-16 MED ORDER — SODIUM CHLORIDE 0.9 % IV SOLN: INTRAVENOUS | Status: DC

## 2021-09-16 MED ORDER — PROCHLORPERAZINE EDISYLATE 10 MG/2ML IJ SOLN: 10.0000 mg | Freq: Once | INTRAMUSCULAR | Status: DC

## 2021-09-16 MED ORDER — SODIUM CHLORIDE 0.9 % IV BOLUS
1000.0000 mL | Freq: Once | INTRAVENOUS | Status: AC
  Administered 2021-09-16: 1000 mL via INTRAVENOUS

## 2021-09-16 MED ORDER — DIPHENHYDRAMINE HCL 50 MG/ML IJ SOLN: 25.0000 mg | Freq: Once | INTRAMUSCULAR | Status: DC

## 2021-09-16 MED ORDER — HYDROCODONE-ACETAMINOPHEN 5-325 MG PO TABS: 1.0000 | ORAL_TABLET | Freq: Four times a day (QID) | ORAL | 0 refills | Status: AC | PRN

## 2021-09-16 MED ORDER — ONDANSETRON 4 MG PO TBDP: 4.0000 mg | ORAL_TABLET | Freq: Three times a day (TID) | ORAL | 1 refills | Status: AC | PRN

## 2021-09-16 MED ORDER — ONDANSETRON HCL 4 MG/2ML IJ SOLN
4.0000 mg | Freq: Once | INTRAMUSCULAR | Status: AC
  Administered 2021-09-16: 4 mg via INTRAVENOUS
  Filled 2021-09-16: qty 2

## 2021-09-16 MED ORDER — HYDROMORPHONE HCL 1 MG/ML IJ SOLN
1.0000 mg | Freq: Once | INTRAMUSCULAR | Status: AC
Start: 2021-09-16 — End: 2021-09-16
  Administered 2021-09-16: 1 mg via INTRAVENOUS
  Filled 2021-09-16: qty 1

## 2021-09-16 NOTE — ED Notes (Addendum)
Assumed care from [facility]. Patient returned from CT and up to [facility] to collect urine sample. Patient denies nausea but states she is still having abdominal discomfort. Call light in reach. Patient laying quietly on gurney. No acute distress noted. Patient updated on plan of care. Will continue to monitor.   1625: Patient medicated for pain. No acute distress noted. Patient given warm blankets for comfort. Will continue to monitor.

## 2021-09-16 NOTE — Discharge Instructions (Addendum)
An appointment follow-up with your primary care doctor.  Today's work-up without any specific abnormal findings.  Take the pain medication for the abdominal pain and the body aches.  Take the antinausea medicine as needed for nausea.  Return for any new or worse symptoms.

## 2021-09-16 NOTE — ED Provider Notes (Signed)
Patient's work-up without any acute findings.  Head CT negative.  CT of the abdomen.  Patient's symptoms also include body ache may very well be a viral syndrome.  Patient states that she had a negative COVID test on Wednesday.  We will treat symptomatically.  Have patient follow-up with primary care provider.  Patient improved here with IV fluids Zofran and hydromorphone.   Vanetta Mulders, MD 09/16/21 [phone]

## 2021-09-16 NOTE — ED Triage Notes (Signed)
Dizziness, abdominal pain, body aches x 2 days. Her home Covid test was negative.

## 2021-09-16 NOTE — ED Provider Notes (Signed)
[facility] EMERGENCY DEPARTMENT Provider Note   CSN: [id number] Arrival date & time: 09/16/21  1225     History Chief Complaint  Patient presents with   Abdominal Pain    Sylvia [name] is a 55 y.o. female.  Presents to ER with concern for multiple symptoms.  Over the past couple days has been suffering from dizziness, abdominal pain, body aches, headache.  Abdominal pain since yesterday, generalized, all over, described as achiness.  Currently moderate, nonradiating.  Headache also aching, moderate, not sudden onset, not worst headache of her life.  Dizziness described as lightheadedness sensation, no room spinning, no syncope.  Denies cough or difficulty in breathing.  Did take at home COVID test which was negative.  No fevers or chills.  HPI     Past Medical History:  Diagnosis Date   Arthritis    oa   Chronic pain    Coronary artery disease    Diabetes mellitus without complication (HCC)    DJD (degenerative joint disease)    Gastric ulcer yrs ago   GERD (gastroesophageal reflux disease)    hx of, none recent   High cholesterol    Impaired hearing    left ear   Measles as child   Mumps as child   Osteoporosis    Vertigo     Patient Active Problem List   Diagnosis Date Noted   Chest pain 02/16/2018   Chronic pain 02/16/2018   High cholesterol 02/16/2018   OSA on CPAP 02/16/2018   Right rotator cuff tear 02/16/2018   Hypokalemia 02/16/2018   Positive D dimer 02/16/2018   OA (osteoarthritis) of hip 03/01/2016    Past Surgical History:  Procedure Laterality Date   ABDOMINAL HYSTERECTOMY     partial   JOINT REPLACEMENT Right 2014   hip replacement   L4 to L 5 disc surgery     TOTAL HIP ARTHROPLASTY Left 03/01/2016   Procedure: LEFT TOTAL HIP ARTHROPLASTY ANTERIOR APPROACH;  Surgeon: Gaynelle Arabian, MD;  Location: [facility];  Service: Orthopedics;  Laterality: Left;   TUBAL LIGATION     UTERINE FIBROID SURGERY       OB History   No obstetric  history on file.     Family History  Problem Relation Age of Onset   Pulmonary embolism Neg Hx    Deep vein thrombosis Neg Hx     Social History   Tobacco Use   Smoking status: Former    Types: Cigarettes    Quit date: 12/19/2015    Years since quitting: 5.7   Smokeless tobacco: Never  Vaping Use   Vaping Use: Never used  Substance Use Topics   Alcohol use: Not Currently    Comment: occasional wine   Drug use: No    Home Medications Prior to Admission medications   Medication Sig Start Date End Date Taking? Authorizing Provider  HYDROcodone-acetaminophen (NORCO/VICODIN) 5-325 MG tablet Take 1 tablet by mouth every 6 (six) hours as needed for moderate pain. 09/16/21  Yes Fredia Sorrow, MD  ondansetron (ZOFRAN ODT) 4 MG disintegrating tablet Take 1 tablet (4 mg total) by mouth every 8 (eight) hours as needed for nausea or vomiting. 09/16/21  Yes Fredia Sorrow, MD  acetaminophen (TYLENOL) 325 MG tablet Take 2 tablets (650 mg total) by mouth every 4 (four) hours as needed for headache or mild pain. 02/16/18   Kinnie Feil, MD  aspirin EC 81 MG tablet Take 81 mg by mouth daily.  [provider]  atorvastatin (LIPITOR) 80 MG tablet Take 80 mg by mouth daily.    [provider]  glipiZIDE (GLUCOTROL XL) 5 MG 24 hr tablet Take 5 mg by mouth daily with breakfast.    [provider]    Allergies    Contrast media [iodinated diagnostic agents], Demerol [meperidine], and Metformin and related  Review of Systems   Review of Systems  Constitutional:  Positive for fatigue. Negative for chills and fever.  HENT:  Negative for ear pain and sore throat.   Eyes:  Negative for pain and visual disturbance.  Respiratory:  Negative for cough and shortness of breath.   Cardiovascular:  Negative for chest pain and palpitations.  Gastrointestinal:  Positive for abdominal pain. Negative for vomiting.  Genitourinary:  Negative for dysuria and hematuria.   Musculoskeletal:  Positive for arthralgias and myalgias. Negative for back pain.  Skin:  Negative for color change and rash.  Neurological:  Positive for dizziness, light-headedness and headaches. Negative for seizures and syncope.  All other systems reviewed and are negative.  Physical Exam Updated Vital Signs BP 109/63   Pulse 72   Temp 98.3 F (36.8 C) (Oral)   Resp 10   Ht 5' 3" (1.6 m)   Wt 113.4 kg   SpO2 100%   BMI 44.29 kg/m   Physical Exam Vitals and nursing note reviewed.  Constitutional:      General: She is not in acute distress.    Appearance: She is well-developed.  HENT:     Head: Normocephalic and atraumatic.  Eyes:     Conjunctiva/sclera: Conjunctivae normal.  Cardiovascular:     Rate and Rhythm: Normal rate and regular rhythm.     Heart sounds: No murmur heard. Pulmonary:     Effort: Pulmonary effort is normal. No respiratory distress.     Breath sounds: Normal breath sounds.  Abdominal:     Palpations: Abdomen is soft.     Tenderness: There is generalized abdominal tenderness. There is no guarding or rebound.  Musculoskeletal:     Cervical back: Neck supple.  Skin:    General: Skin is warm and dry.  Neurological:     Mental Status: She is alert.    ED Results / Procedures / Treatments   Labs (all labs ordered are listed, but only abnormal results are displayed) Labs Reviewed  LIPASE, BLOOD - Abnormal; Notable for the following components:      Result Value   Lipase 103 (*)    All other components within normal limits  COMPREHENSIVE METABOLIC PANEL - Abnormal; Notable for the following components:   Creatinine, Ser 1.01 (*)    Calcium 8.7 (*)    ALT 45 (*)    All other components within normal limits  URINALYSIS, ROUTINE W REFLEX MICROSCOPIC - Abnormal; Notable for the following components:   Hgb urine dipstick TRACE (*)    All other components within normal limits  URINALYSIS, MICROSCOPIC (REFLEX) - Abnormal; Notable for the following  components:   Bacteria, UA FEW (*)    All other components within normal limits  CBC    EKG EKG Interpretation  Date/Time:  Friday September 16 2021 13:40:11 EDT Ventricular Rate:  69 PR Interval:  160 QRS Duration: 112 QT Interval:  436 QTC Calculation: 468 R Axis:   -14 Text Interpretation: Sinus rhythm Left ventricular hypertrophy Reconfirmed by Fredia Sorrow [phone]) on 09/16/2021 3:50:23 PM  Radiology CT ABDOMEN PELVIS WO CONTRAST  Result Date: 09/16/2021 CLINICAL DATA:  Diffuse abdominal pain and body aches for 2 days. Dizziness. IV contrast allergy. EXAM: CT ABDOMEN AND PELVIS WITHOUT CONTRAST TECHNIQUE: Multidetector CT imaging of the abdomen and pelvis was performed following the standard protocol without IV contrast. COMPARISON:  02/09/2021 from [facility] FINDINGS: Lower chest: No acute findings. Hepatobiliary: No mass visualized on this unenhanced exam. Gallbladder is unremarkable. No evidence of biliary ductal dilatation. Pancreas: No mass or inflammatory process visualized on this unenhanced exam. Spleen:  Within normal limits in size. Adrenals/Urinary tract: No evidence of urolithiasis or hydronephrosis. Unremarkable unopacified urinary bladder. Stomach/Bowel: No evidence of obstruction, inflammatory process, or abnormal fluid collections. Normal appendix visualized. Mild left colonic diverticulosis is noted, without evidence of diverticulitis. Vascular/Lymphatic: No pathologically enlarged lymph nodes identified. No evidence of abdominal aortic aneurysm. Aortic atherosclerotic calcification noted. Reproductive: Prior hysterectomy noted. Adnexal regions are unremarkable in appearance. Other: Inferior pelvis is suboptimally visualized due to significant artifact from bilateral hip prostheses. Musculoskeletal:  No suspicious bone lesions identified. IMPRESSION: No evidence of urolithiasis, hydronephrosis, or other acute findings. Mild left colonic diverticulosis, without  radiographic evidence of diverticulitis. Aortic Atherosclerosis (ICD10-I70.0). Electronically Signed   By: Marlaine Hind M.D.   On: 09/16/2021 15:46   CT Head Wo Contrast  Result Date: 09/16/2021 CLINICAL DATA:  Headache.  Dizziness.  Body aches. EXAM: CT HEAD WITHOUT CONTRAST TECHNIQUE: Contiguous axial images were obtained from the base of the skull through the vertex without intravenous contrast. COMPARISON:  None. FINDINGS: Brain: No evidence of acute infarction, hemorrhage, hydrocephalus, extra-axial collection or mass lesion/mass effect. Vascular: No hyperdense vessel or unexpected calcification. Skull: Normal. Negative for fracture or focal lesion. Sinuses/Orbits: No acute finding. Other: None. IMPRESSION: No acute intracranial abnormalities. Normal brain. Electronically Signed   By: Kerby Moors M.D.   On: 09/16/2021 15:10    Procedures Ultrasound ED Peripheral IV (Provider)  Date/Time: 09/17/2021 3:53 PM Performed by: Lucrezia Starch, MD Authorized by: Lucrezia Starch, MD   Procedure details:    Indications: hydration, multiple failed IV attempts and poor IV access     Skin Prep: povidone-iodine     Location:  Left AC   Angiocath:  20 G   Bedside Ultrasound Guided: Yes     Patient tolerated procedure without complications: Yes     Dressing applied: Yes     Medications Ordered in ED Medications  sodium chloride 0.9 % bolus 1,000 mL (0 mLs Intravenous Stopped 09/16/21 1835)  ondansetron (ZOFRAN) injection 4 mg (4 mg Intravenous Given 09/16/21 1618)  HYDROmorphone (DILAUDID) injection 1 mg (1 mg Intravenous Given 09/16/21 1618)    ED Course  I have reviewed the triage vital signs and the nursing notes.  Pertinent labs & imaging results that were available during my care of the patient were reviewed by me and considered in my medical decision making (see chart for details).    MDM Rules/Calculators/A&P                           55 year old lady presents to ER with  myriad complaints including headache, abdominal pain, body aches.  On exam well-appearing in no distress but did have some generalized tenderness to palpation although no rebound or guarding.  Will check basic labs, CT head and abdomen pelvis to further evaluate.  While awaiting work-up and reassessment, signed out to Dr. Rogene Houston.  Refer to his note for final plan and disposition.  After the discussed management above, the patient was determined  to be safe for discharge.  The patient was in agreement with this plan and all questions regarding their care were answered.  ED return precautions were discussed and the patient will return to the ED with any significant worsening of condition.   Final Clinical Impression(s) / ED Diagnoses Final diagnoses:  Generalized abdominal pain  Headache disorder    Rx / DC Orders ED Discharge Orders          Ordered    HYDROcodone-acetaminophen (NORCO/VICODIN) 5-325 MG tablet  Every 6 hours PRN        09/16/21 1759    ondansetron (ZOFRAN ODT) 4 MG disintegrating tablet  Every 8 hours PRN        09/16/21 1759             Lucrezia Starch, MD 09/17/21 1554

## 2021-09-16 NOTE — ED Notes (Signed)
Attempted to obtain IV access 2 including another staff member

## 2021-09-28 ENCOUNTER — Other Ambulatory Visit: Payer: Self-pay

## 2021-09-28 ENCOUNTER — Emergency Department (HOSPITAL_BASED_OUTPATIENT_CLINIC_OR_DEPARTMENT_OTHER)
Admission: EM | Admit: 2021-09-28 | Discharge: 2021-09-28 | Disposition: A | Discharge status: Home / Self Care | Payer: 59 | Attending: Emergency Medicine | Admitting: Emergency Medicine

## 2021-09-28 ENCOUNTER — Encounter (HOSPITAL_BASED_OUTPATIENT_CLINIC_OR_DEPARTMENT_OTHER): Payer: Self-pay

## 2021-09-28 ENCOUNTER — Emergency Department (HOSPITAL_BASED_OUTPATIENT_CLINIC_OR_DEPARTMENT_OTHER): Payer: 59

## 2021-09-28 DIAGNOSIS — I2510 Atherosclerotic heart disease of native coronary artery without angina pectoris: Secondary | ICD-10-CM | POA: Diagnosis not present

## 2021-09-28 DIAGNOSIS — Z7982 Long term (current) use of aspirin: Secondary | ICD-10-CM | POA: Diagnosis not present

## 2021-09-28 DIAGNOSIS — M25531 Pain in right wrist: Secondary | ICD-10-CM | POA: Diagnosis not present

## 2021-09-28 DIAGNOSIS — E119 Type 2 diabetes mellitus without complications: Secondary | ICD-10-CM | POA: Insufficient documentation

## 2021-09-28 DIAGNOSIS — F17290 Nicotine dependence, other tobacco product, uncomplicated: Secondary | ICD-10-CM | POA: Diagnosis not present

## 2021-09-28 DIAGNOSIS — Z96643 Presence of artificial hip joint, bilateral: Secondary | ICD-10-CM | POA: Insufficient documentation

## 2021-09-28 DIAGNOSIS — Z7984 Long term (current) use of oral hypoglycemic drugs: Secondary | ICD-10-CM | POA: Diagnosis not present

## 2021-09-28 MED ORDER — NAPROXEN 375 MG PO TABS: 375.0000 mg | ORAL_TABLET | Freq: Two times a day (BID) | ORAL | 0 refills | Status: AC

## 2021-09-28 NOTE — Discharge Instructions (Addendum)
Please refer to attached instructions. Follow up with sports medicine or your orthopedist as discussed.

## 2021-09-28 NOTE — ED Provider Notes (Signed)
[facility] EMERGENCY DEPARTMENT Provider Note   CSN: [id number] Arrival date & time: 09/28/21  1246     History Chief Complaint  Patient presents with   Wrist Pain    Sylvia [name] is a 55 y.o. female.  Patient with right wrist pain x 1 week. No known injury. Does not perform any heavy lifting activities. Drives a city bus. History of osteoarthritis. States that wrist "pops" when rotated. Pain radiates up along distal radius.  The history is provided by the patient. No language interpreter was used.  Wrist Pain This is a new problem. The current episode started more than 2 days ago. The problem occurs constantly. The problem has been gradually worsening. She has tried nothing for the symptoms.      Past Medical History:  Diagnosis Date   Arthritis    oa   Chronic pain    Coronary artery disease    Diabetes mellitus without complication (HCC)    DJD (degenerative joint disease)    Gastric ulcer yrs ago   GERD (gastroesophageal reflux disease)    hx of, none recent   High cholesterol    Impaired hearing    left ear   Measles as child   Mumps as child   Osteoporosis    Vertigo     Patient Active Problem List   Diagnosis Date Noted   Chest pain 02/16/2018   Chronic pain 02/16/2018   High cholesterol 02/16/2018   OSA on CPAP 02/16/2018   Right rotator cuff tear 02/16/2018   Hypokalemia 02/16/2018   Positive D dimer 02/16/2018   OA (osteoarthritis) of hip 03/01/2016    Past Surgical History:  Procedure Laterality Date   ABDOMINAL HYSTERECTOMY     partial   JOINT REPLACEMENT Right 2014   hip replacement   L4 to L 5 disc surgery     TOTAL HIP ARTHROPLASTY Left 03/01/2016   Procedure: LEFT TOTAL HIP ARTHROPLASTY ANTERIOR APPROACH;  Surgeon: Ollen Gross, MD;  Location: [facility];  Service: Orthopedics;  Laterality: Left;   TUBAL LIGATION     UTERINE FIBROID SURGERY       OB History   No obstetric history on file.     Family History  Problem  Relation Age of Onset   Pulmonary embolism Neg Hx    Deep vein thrombosis Neg Hx     Social History   Tobacco Use   Smoking status: Every Day    Types: Cigars   Smokeless tobacco: Never  Vaping Use   Vaping Use: Never used  Substance Use Topics   Alcohol use: Not Currently   Drug use: No    Home Medications Prior to Admission medications   Medication Sig Start Date End Date Taking? Authorizing Provider  acetaminophen (TYLENOL) 325 MG tablet Take 2 tablets (650 mg total) by mouth every 4 (four) hours as needed for headache or mild pain. 02/16/18   Esperanza Sheets, MD  aspirin EC 81 MG tablet Take 81 mg by mouth daily.    [provider]  atorvastatin (LIPITOR) 80 MG tablet Take 80 mg by mouth daily.    [provider]  glipiZIDE (GLUCOTROL XL) 5 MG 24 hr tablet Take 5 mg by mouth daily with breakfast.    [provider]  HYDROcodone-acetaminophen (NORCO/VICODIN) 5-325 MG tablet Take 1 tablet by mouth every 6 (six) hours as needed for moderate pain. 09/16/21   Vanetta Mulders, MD  ondansetron (ZOFRAN ODT) 4 MG disintegrating tablet Take  1 tablet (4 mg total) by mouth every 8 (eight) hours as needed for nausea or vomiting. 09/16/21   Vanetta Mulders, MD    Allergies    Contrast media [iodinated diagnostic agents], Demerol [meperidine], and Metformin and related  Review of Systems   Review of Systems  Musculoskeletal:  Positive for arthralgias.  All other systems reviewed and are negative.  Physical Exam Updated Vital Signs BP 115/86 (BP Location: Left Arm)   Pulse 86   Temp 98.4 F (36.9 C) (Oral)   Resp 18   SpO2 96%   Physical Exam Constitutional:      Appearance: Normal appearance.  HENT:     Head: Normocephalic.     Nose: Nose normal.  Eyes:     Conjunctiva/sclera: Conjunctivae normal.  Cardiovascular:     Rate and Rhythm: Normal rate.  Pulmonary:     Effort: Pulmonary effort is normal.  Musculoskeletal:        General: Tenderness  present.     Right wrist: Tenderness present. No deformity. Decreased range of motion.     Comments: Decreased ROM d/t discomfort  Skin:    General: Skin is warm and dry.  Neurological:     Mental Status: She is alert and oriented to person, place, and time.  Psychiatric:        Mood and Affect: Mood normal.        Behavior: Behavior normal.    ED Results / Procedures / Treatments   Labs (all labs ordered are listed, but only abnormal results are displayed) Labs Reviewed - No data to display  EKG None  Radiology DG Wrist Complete Right  Result Date: 09/28/2021 CLINICAL DATA:  Right wrist swelling and pain without trauma EXAM: RIGHT WRIST - COMPLETE 3+ VIEW COMPARISON:  02/26/2017 hand radiographs FINDINGS: Mild degenerative changes involve the radiocarpal articulation and less so the radial intercarpal bones. No acute fracture or dislocation. Scaphoid intact. Osseous irregularity involving the radial aspect of the radiocarpal articulation is favored to be related to an osteophyte. Similar. IMPRESSION: Degenerative change, without acute osseous finding. Electronically Signed   By: Jeronimo Greaves M.D.   On: 09/28/2021 13:36    Procedures Procedures   Medications Ordered in ED Medications - No data to display  ED Course  I have reviewed the triage vital signs and the nursing notes.  Pertinent labs & imaging results that were available during my care of the patient were reviewed by me and considered in my medical decision making (see chart for details).    MDM Rules/Calculators/A&P                           Patient with right wrist pain. Xray with degenerative changes of the wrist, negative for acute osseous findings. Pt advised to follow up with orthopedics. Patient given wrist splint while in ED, conservative therapy recommended and discussed. Patient will be discharged home & is agreeable with above plan. Returns precautions discussed. Pt appears safe for discharge.  Final  Clinical Impression(s) / ED Diagnoses Final diagnoses:  Right wrist pain    Rx / DC Orders ED Discharge Orders          Ordered    naproxen (NAPROSYN) 375 MG tablet  2 times daily        09/28/21 1426             Felicie Morn, NP 09/28/21 1430    Melene Plan, DO 09/28/21 1453

## 2021-09-28 NOTE — ED Triage Notes (Signed)
Pt c/o right wrist pain x 1 week-denies injury-states wrist is popping when she turns wrist-NAD-steady gait

## 2021-10-03 ENCOUNTER — Ambulatory Visit: Payer: Self-pay

## 2021-10-03 ENCOUNTER — Encounter: Payer: Self-pay | Admitting: Family Medicine

## 2021-10-03 ENCOUNTER — Ambulatory Visit (INDEPENDENT_AMBULATORY_CARE_PROVIDER_SITE_OTHER): Payer: 59 | Admitting: Family Medicine

## 2021-10-03 VITALS — Ht 63.0 in | Wt 250.0 lb

## 2021-10-03 DIAGNOSIS — M65831 Other synovitis and tenosynovitis, right forearm: Secondary | ICD-10-CM | POA: Insufficient documentation

## 2021-10-03 DIAGNOSIS — M25531 Pain in right wrist: Secondary | ICD-10-CM

## 2021-10-03 MED ORDER — DICLOFENAC SODIUM 2 % EX SOLN: 1.0000 "application " | Freq: Two times a day (BID) | CUTANEOUS | 2 refills | Status: AC

## 2021-10-03 NOTE — Progress Notes (Signed)
Sylvia [name] - 55 y.o. female MRN [id number]  Date of birth: [birth year]  SUBJECTIVE:  Including CC & ROS.  No chief complaint on file.   Sylvia [name] is a 55 y.o. female that is presenting with right wrist pain.  Pain has been ongoing for a few weeks.  No improvement with recent wrist brace.  Independent review of the right wrist x-ray from 10/12 shows no acute changes.   Review of Systems See HPI   HISTORY: Past Medical, Surgical, Social, and Family History Reviewed & Updated per EMR.   Pertinent Historical Findings include:  Past Medical History:  Diagnosis Date   Arthritis    oa   Chronic pain    Coronary artery disease    Diabetes mellitus without complication (HCC)    DJD (degenerative joint disease)    Gastric ulcer yrs ago   GERD (gastroesophageal reflux disease)    hx of, none recent   High cholesterol    Impaired hearing    left ear   Measles as child   Mumps as child   Osteoporosis    Vertigo     Past Surgical History:  Procedure Laterality Date   ABDOMINAL HYSTERECTOMY     partial   JOINT REPLACEMENT Right 2014   hip replacement   L4 to L 5 disc surgery     TOTAL HIP ARTHROPLASTY Left 03/01/2016   Procedure: LEFT TOTAL HIP ARTHROPLASTY ANTERIOR APPROACH;  Surgeon: Ollen Gross, MD;  Location: [facility];  Service: Orthopedics;  Laterality: Left;   TUBAL LIGATION     UTERINE FIBROID SURGERY      Family History  Problem Relation Age of Onset   Pulmonary embolism Neg Hx    Deep vein thrombosis Neg Hx     Social History   Socioeconomic History   Marital status: Married    Spouse name: Not on file   Number of children: Not on file   Years of education: Not on file   Highest education level: Not on file  Occupational History   Not on file  Tobacco Use   Smoking status: Every Day    Types: Cigars   Smokeless tobacco: Never  Vaping Use   Vaping Use: Never used  Substance and Sexual Activity   Alcohol use: Not Currently   Drug use: No    Sexual activity: Not on file  Other Topics Concern   Not on file  Social History Narrative   Not on file   Social Determinants of Health   Financial Resource Strain: Not on file  Food Insecurity: Not on file  Transportation Needs: Not on file  Physical Activity: Not on file  Stress: Not on file  Social Connections: Not on file  Intimate Partner Violence: Not on file     PHYSICAL EXAM:  VS: Ht 5\' 3"  (1.6 m)   Wt 250 lb (113.4 kg)   BMI 44.29 kg/m  Physical Exam Gen: NAD, alert, cooperative with exam, well-appearing   Limited ultrasound: Right wrist:  Effusion noted within the first dorsal compartment that extends back to the intersection.  No significant hyperemia No degenerative change appreciated of the wrist  Summary: Findings consistent with intersection syndrome  Ultrasound and interpretation by , MD     ASSESSMENT & PLAN:   Extensor intersection syndrome, right Symptoms most consistent with intersection syndrome.  Unclear if there is underlying inflammatory source as she does no repetitive activities. -Counseled on home exercise therapy and supportive care. -Thumb spica. -  Could consider injection -Pen[place]aid

## 2021-10-03 NOTE — Assessment & Plan Note (Addendum)
Symptoms most consistent with intersection syndrome.  Unclear if there is underlying inflammatory source as she does no repetitive activities. -Counseled on home exercise therapy and supportive care. -Thumb spica. -Could consider injection -[place]aid

## 2021-10-03 NOTE — Patient Instructions (Addendum)
Nice to meet you  Please try the brace  Please try ice as needed  Please try the rub on medicine  Please send me a message in MyChart with any questions or updates.  Please see me back in 2-3 weeks.   --Dr. Jordan Likes

## 2021-10-11 ENCOUNTER — Encounter: Payer: Self-pay | Admitting: Physical Medicine & Rehabilitation

## 2021-10-17 ENCOUNTER — Ambulatory Visit (INDEPENDENT_AMBULATORY_CARE_PROVIDER_SITE_OTHER): Payer: 59 | Admitting: Family Medicine

## 2021-10-17 ENCOUNTER — Ambulatory Visit: Payer: Self-pay

## 2021-10-17 VITALS — BP 143/85 | Ht 63.5 in | Wt 248.0 lb

## 2021-10-17 DIAGNOSIS — M65831 Other synovitis and tenosynovitis, right forearm: Secondary | ICD-10-CM

## 2021-10-17 MED ORDER — METHYLPREDNISOLONE ACETATE 40 MG/ML IJ SUSP
20.0000 mg | Freq: Once | INTRAMUSCULAR | Status: AC
  Administered 2021-10-17: 20 mg via INTRA_ARTICULAR

## 2021-10-17 NOTE — Addendum Note (Signed)
Addended by: Annita Brod on: 10/17/2021 10:43 AM   Modules accepted: Orders

## 2021-10-17 NOTE — Assessment & Plan Note (Signed)
Still having pain over the first and second dorsal compartment with pain originating at the intersection. -Counseled on home exercise therapy and supportive care. -Injection today. -Counseled on thumb spica. -Could consider physical therapy

## 2021-10-17 NOTE — Progress Notes (Signed)
Sylvia [name] - 56 y.o. female MRN [id number]  Date of birth: [birth year]  SUBJECTIVE:  Including CC & ROS.  No chief complaint on file.   Sylvia [name] is a 55 y.o. female that is presenting with ongoing right wrist pain.  Pain is a burning sensation occurring over the dorsal aspect.  Limited improvement with thumb spica.   Review of Systems See HPI   HISTORY: Past Medical, Surgical, Social, and Family History Reviewed & Updated per EMR.   Pertinent Historical Findings include:  Past Medical History:  Diagnosis Date   Arthritis    oa   Chronic pain    Coronary artery disease    Diabetes mellitus without complication (HCC)    DJD (degenerative joint disease)    Gastric ulcer yrs ago   GERD (gastroesophageal reflux disease)    hx of, none recent   High cholesterol    Impaired hearing    left ear   Measles as child   Mumps as child   Osteoporosis    Vertigo     Past Surgical History:  Procedure Laterality Date   ABDOMINAL HYSTERECTOMY     partial   JOINT REPLACEMENT Right 2014   hip replacement   L4 to L 5 disc surgery     TOTAL HIP ARTHROPLASTY Left 03/01/2016   Procedure: LEFT TOTAL HIP ARTHROPLASTY ANTERIOR APPROACH;  Surgeon: Ollen Gross, MD;  Location: [facility];  Service: Orthopedics;  Laterality: Left;   TUBAL LIGATION     UTERINE FIBROID SURGERY      Family History  Problem Relation Age of Onset   Pulmonary embolism Neg Hx    Deep vein thrombosis Neg Hx     Social History   Socioeconomic History   Marital status: Married    Spouse name: Not on file   Number of children: Not on file   Years of education: Not on file   Highest education level: Not on file  Occupational History   Not on file  Tobacco Use   Smoking status: Every Day    Types: Cigars   Smokeless tobacco: Never  Vaping Use   Vaping Use: Never used  Substance and Sexual Activity   Alcohol use: Not Currently   Drug use: No   Sexual activity: Not on file  Other Topics Concern    Not on file  Social History Narrative   Not on file   Social Determinants of Health   Financial Resource Strain: Not on file  Food Insecurity: Not on file  Transportation Needs: Not on file  Physical Activity: Not on file  Stress: Not on file  Social Connections: Not on file  Intimate Partner Violence: Not on file     PHYSICAL EXAM:  VS: BP (!) 143/85   Ht 5' 3.5" (1.613 m)   Wt 248 lb (112.5 kg)   BMI 43.24 kg/m  Physical Exam Gen: NAD, alert, cooperative with exam, well-appearing    Aspiration/Injection Procedure Note Sylvia [name] 1966/01/23  Procedure: Injection Indications: Right wrist pain  Procedure Details Consent: Risks of procedure as well as the alternatives and risks of each were explained to the (patient/caregiver).  Consent for procedure obtained. Time Out: Verified patient identification, verified procedure, site/side was marked, verified correct patient position, special equipment/implants available, medications/allergies/relevent history reviewed, required imaging and test results available.  Performed.  The area was cleaned with iodine and alcohol swabs.    The right intersection of the wrist was injected using 0.5 cc's of  40 mg Depo-Medrol and 2 cc's of 0.25% bupivacaine with a 25 1 1/2" needle.  Ultrasound was used. Images were obtained in long views showing the injection.     A sterile dressing was applied.  Patient did tolerate procedure well.      ASSESSMENT & PLAN:   Extensor intersection syndrome, right Still having pain over the first and second dorsal compartment with pain originating at the intersection. -Counseled on home exercise therapy and supportive care. -Injection today. -Counseled on thumb spica. -Could consider physical therapy

## 2021-10-17 NOTE — Patient Instructions (Signed)
Good to see you  Please use ice as needed  Please use the brace. You can slowly wean out as the pain allows.  Please send me a message in MyChart with any questions or updates.  Please see me back in 4 weeks.   --Dr. Jordan Likes

## 2021-11-14 ENCOUNTER — Ambulatory Visit: Payer: 59 | Admitting: Family Medicine

## 2021-12-01 ENCOUNTER — Other Ambulatory Visit: Payer: Self-pay

## 2021-12-01 ENCOUNTER — Encounter
Payer: Worker's Compensation | Attending: Physical Medicine & Rehabilitation | Admitting: Physical Medicine & Rehabilitation

## 2021-12-01 ENCOUNTER — Encounter: Payer: Self-pay | Admitting: Physical Medicine & Rehabilitation

## 2021-12-01 VITALS — BP 123/80 | HR 89 | Temp 98.2°F | Ht 63.5 in | Wt 253.0 lb

## 2021-12-01 DIAGNOSIS — M533 Sacrococcygeal disorders, not elsewhere classified: Secondary | ICD-10-CM
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
## 2021-12-01 NOTE — Patient Instructions (Signed)
May return to work with restrictions as per Dr Christain Sacramento letter diagnosed 11/04/2021

## 2021-12-06 ENCOUNTER — Other Ambulatory Visit (HOSPITAL_BASED_OUTPATIENT_CLINIC_OR_DEPARTMENT_OTHER): Payer: Self-pay

## 2021-12-06 MED ORDER — OZEMPIC (0.25 OR 0.5 MG/DOSE) 2 MG/1.5ML ~~LOC~~ SOPN
PEN_INJECTOR | SUBCUTANEOUS | 1 refills | Status: AC
  Filled 2021-12-06: qty 1.5, 28d supply, fill #0

## 2021-12-16 IMAGING — CT CT ABD-PELV W/O CM
2 of 4 series · 17 of 46 positions shown, 19 images · non-contrast
Comparison: 02/09/2021 from [REDACTED]

CLINICAL DATA: Diffuse abdominal pain and body aches for 2 days.
Dizziness. IV contrast allergy.

EXAM:
CT ABDOMEN AND PELVIS WITHOUT CONTRAST
TECHNIQUE: Multidetector CT imaging of the abdomen and pelvis was performed
following the standard protocol without IV contrast.

[Series 3: axial st · axial · 0.98mm/px · z∈[-845,-425]mm · 14 of 92 slices shown, 16 images]
[im 4/92  soft-tissue]
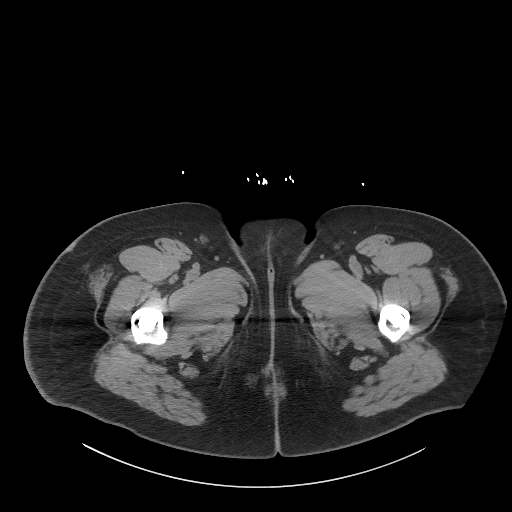
[im 4/92  bone]
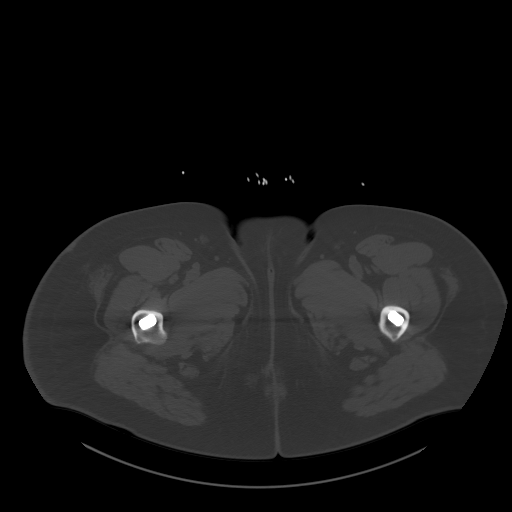
[im 11/92  soft-tissue]
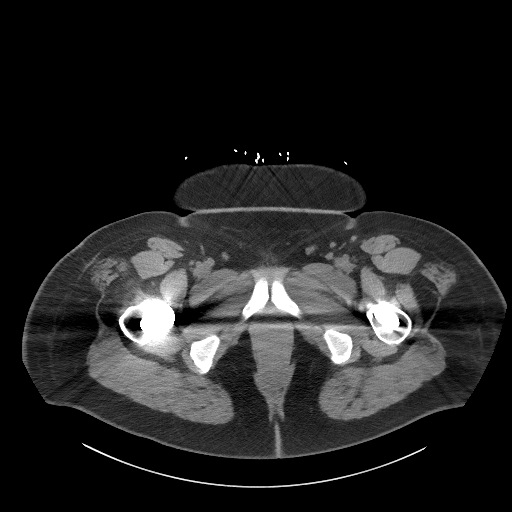
[im 19/92  soft-tissue]
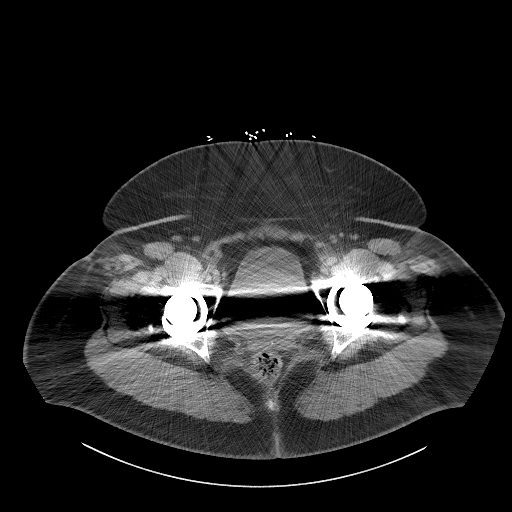
[im 26/92  soft-tissue]
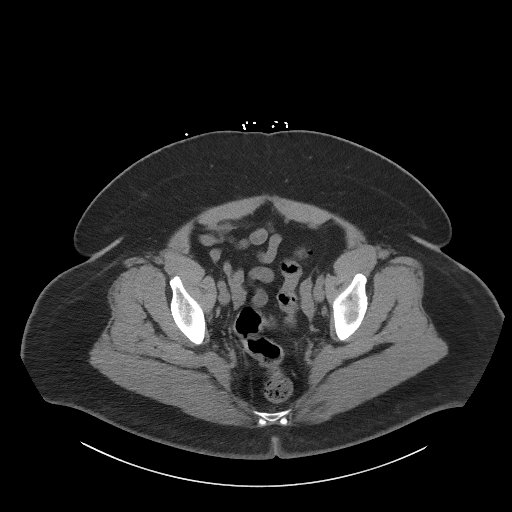
[im 30/92  soft-tissue]
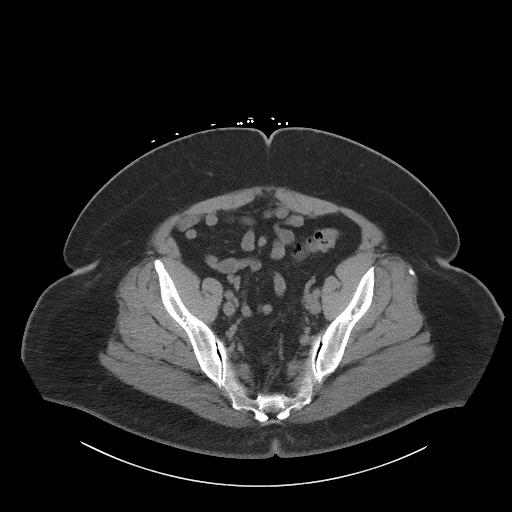
[im 37/92  soft-tissue]
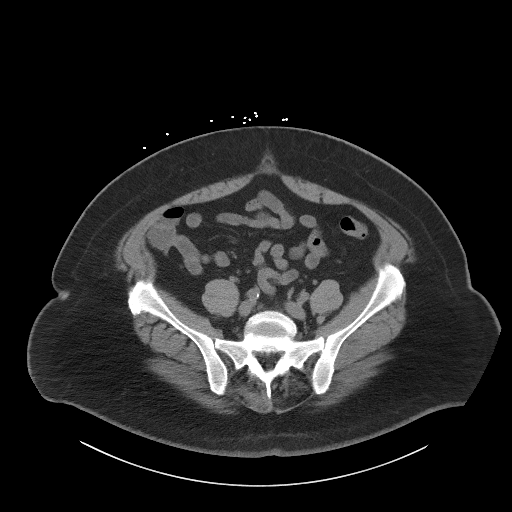
[im 44/92  soft-tissue]
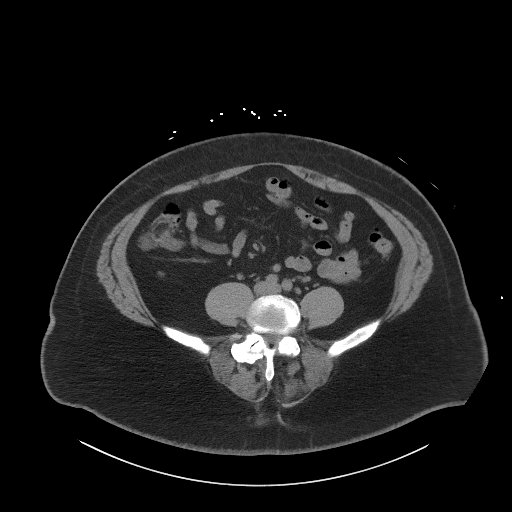
[im 48/92  soft-tissue]
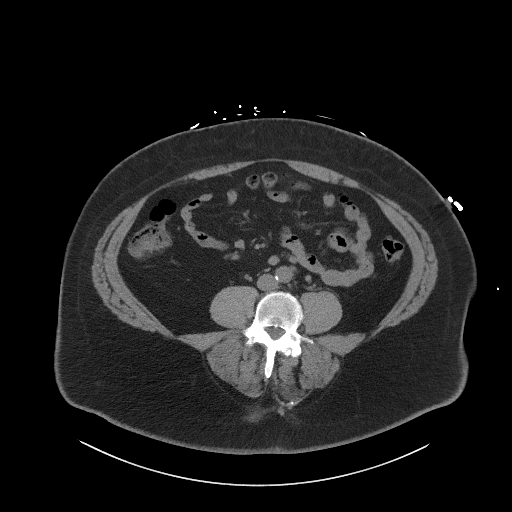
[im 55/92  soft-tissue]
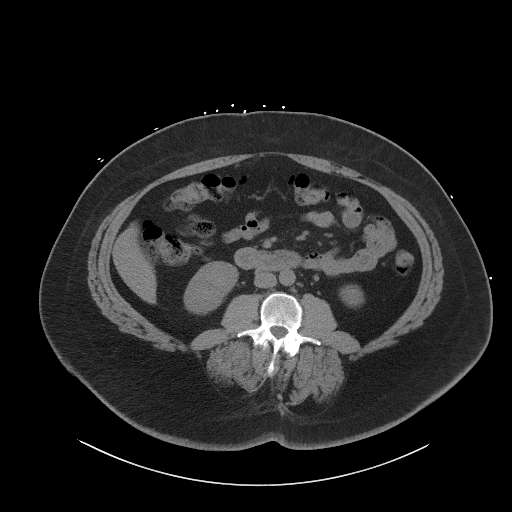
[im 55/92  bone]
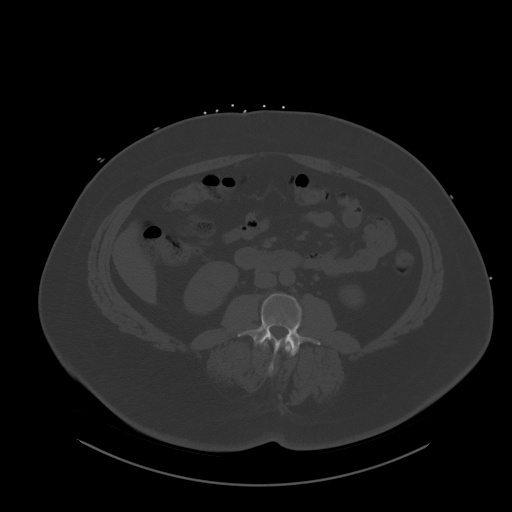
[im 62/92  soft-tissue]
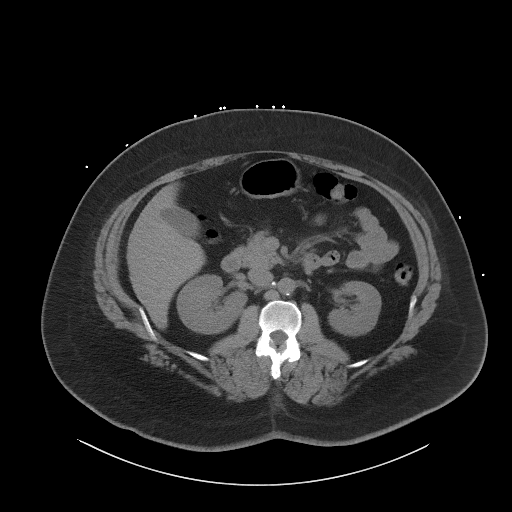
[im 70/92  soft-tissue]
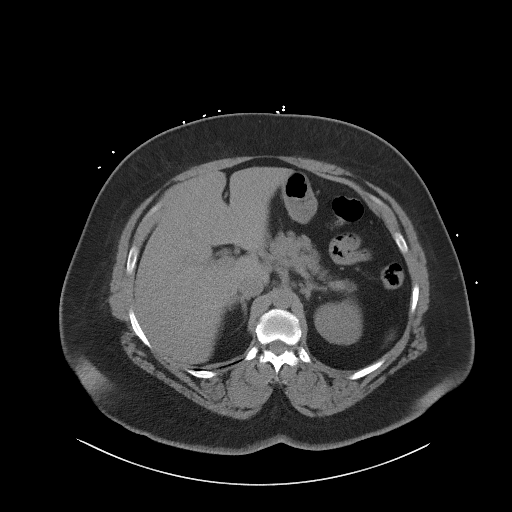
[im 73/92  soft-tissue]
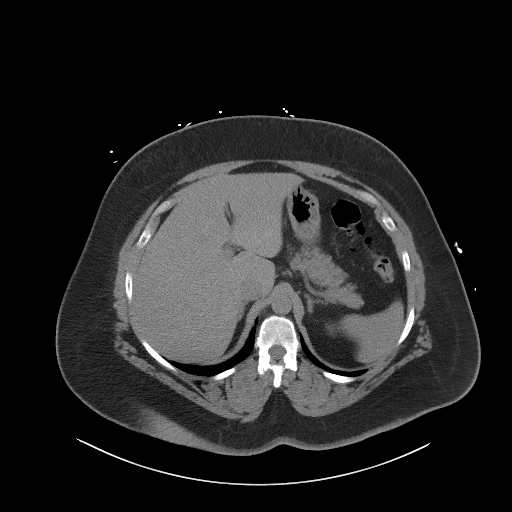
[im 81/92  soft-tissue]
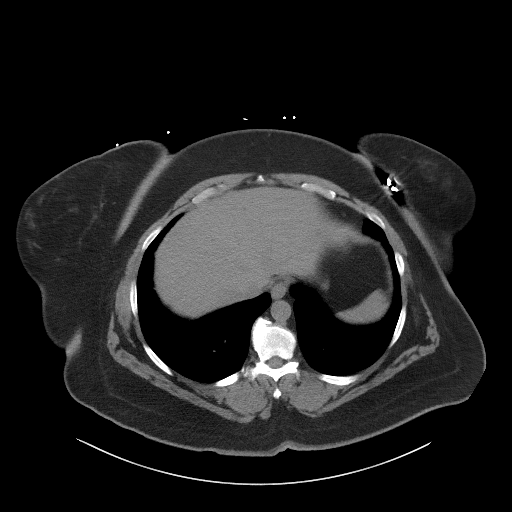
[im 88/92  soft-tissue]
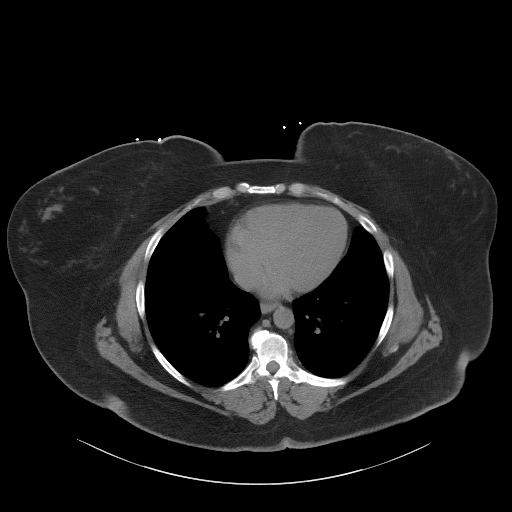

[Series 6: coronal st · coronal · 0.93mm/px · 3 of 116 slices shown]
[im 39/116  soft-tissue]
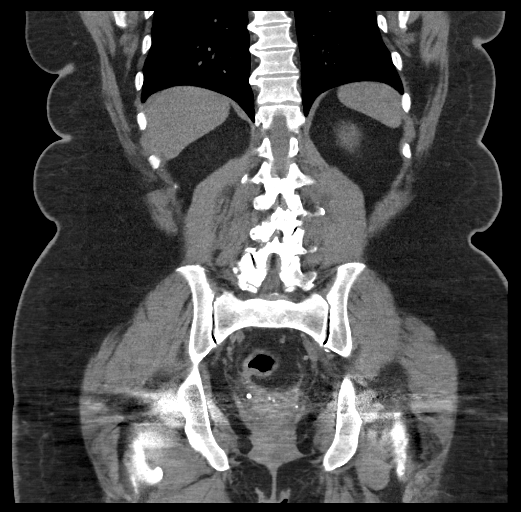
[im 52/116  soft-tissue]
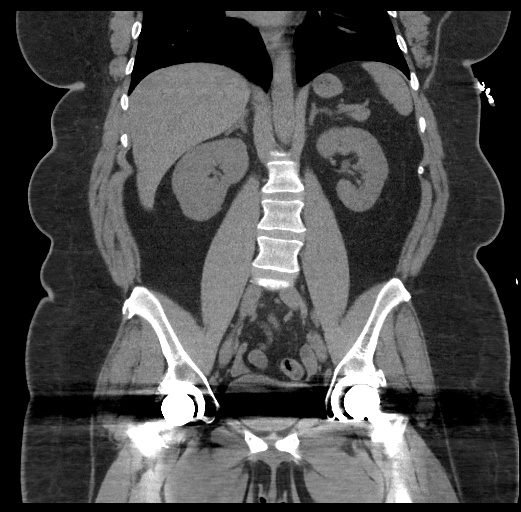
[im 64/116  soft-tissue]
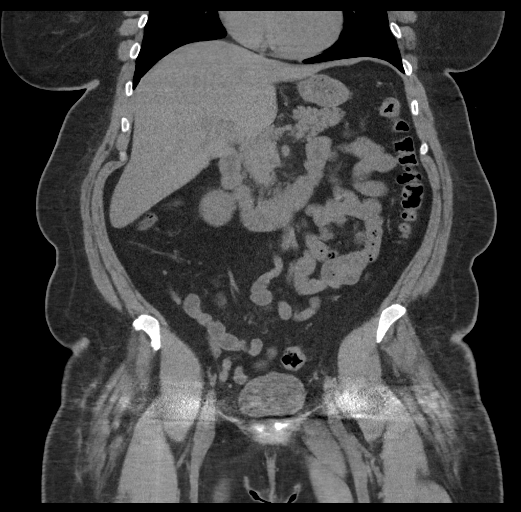

[17 of 46 positions shown; findings below may reference images not displayed]

FINDINGS: Lower chest: No acute findings.

Hepatobiliary: No mass visualized on this unenhanced exam.
Gallbladder is unremarkable. No evidence of biliary ductal
dilatation.

Pancreas: No mass or inflammatory process visualized on this
unenhanced exam.

Spleen:  Within normal limits in size.

Adrenals/Urinary tract: No evidence of urolithiasis or
hydronephrosis. Unremarkable unopacified urinary bladder.

Stomach/Bowel: No evidence of obstruction, inflammatory process, or
abnormal fluid collections. Normal appendix visualized. Mild left
colonic diverticulosis is noted, without evidence of diverticulitis.

Vascular/Lymphatic: No pathologically enlarged lymph nodes
identified. No evidence of abdominal aortic aneurysm. Aortic
atherosclerotic calcification noted.

Reproductive: Prior hysterectomy noted. Adnexal regions are
unremarkable in appearance.

Other: Inferior pelvis is suboptimally visualized due to significant
artifact from bilateral hip prostheses.

Musculoskeletal:  No suspicious bone lesions identified.
IMPRESSION: No evidence of urolithiasis, hydronephrosis, or other acute
findings.

Mild left colonic diverticulosis, without radiographic evidence of
diverticulitis.

Aortic Atherosclerosis ([id number]).

## 2021-12-16 IMAGING — CT CT HEAD W/O CM
3 series · 16 of 47 positions shown, 19 images · non-contrast
Comparison: None.

CLINICAL DATA: Headache.  Dizziness.  Body aches.

EXAM:
CT HEAD WITHOUT CONTRAST
TECHNIQUE: Contiguous axial images were obtained from the base of the skull
through the vertex without intravenous contrast.

[Series 2: head wo · axial · 0.45mm/px · z∈[-157,-32]mm · 10 of 31 slices shown, 13 images]
[im 3/31  brain]
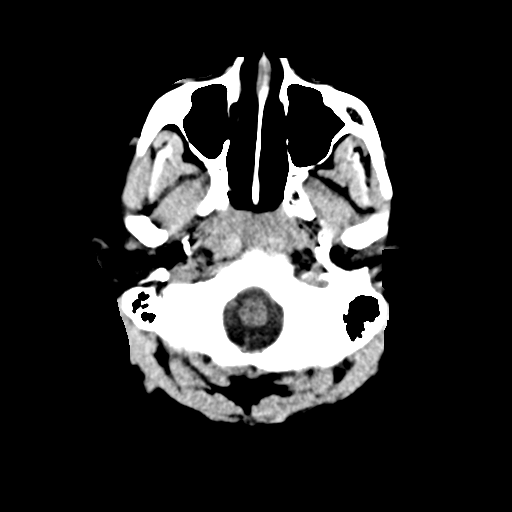
[im 3/31  bone]
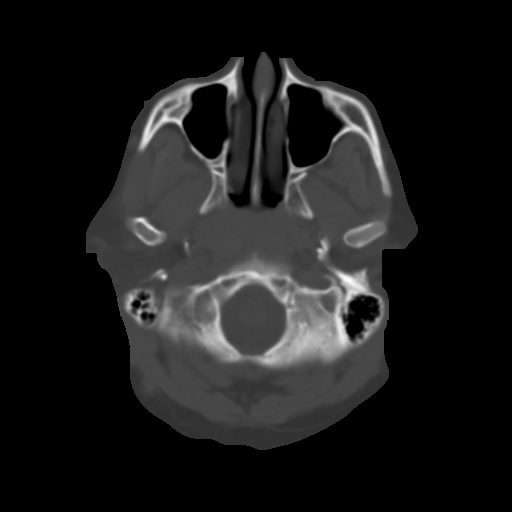
[im 6/31  brain]
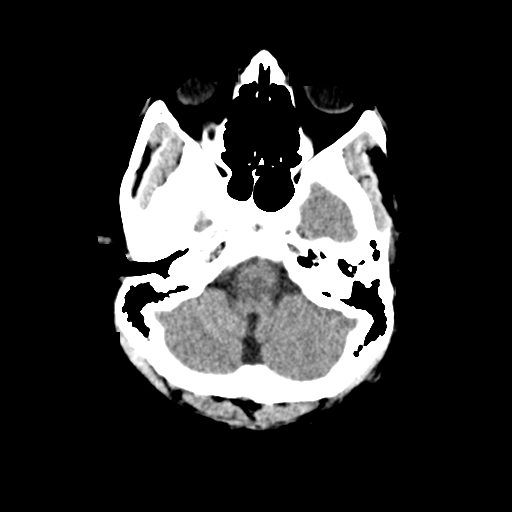
[im 9/31  brain]
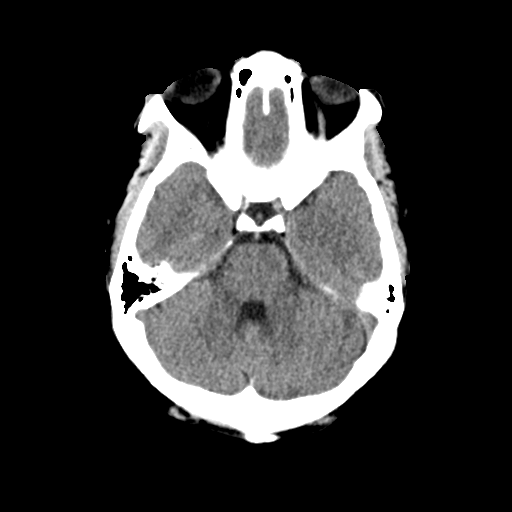
[im 11/31  brain]
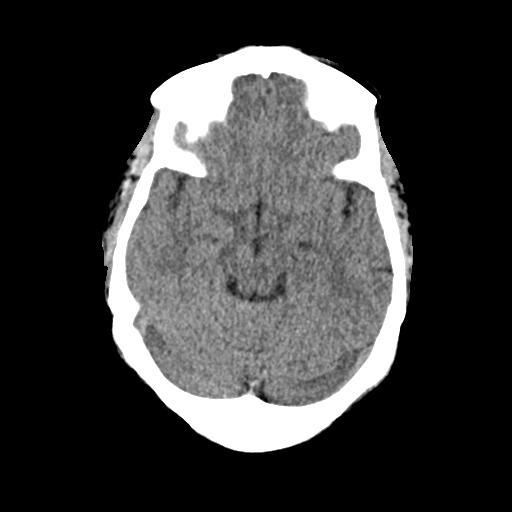
[im 14/31  brain]
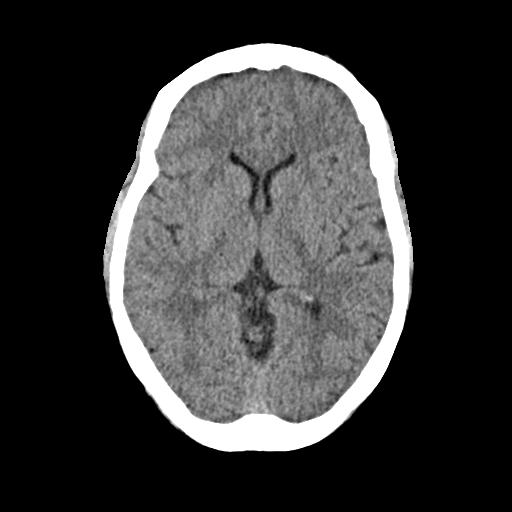
[im 14/31  bone]
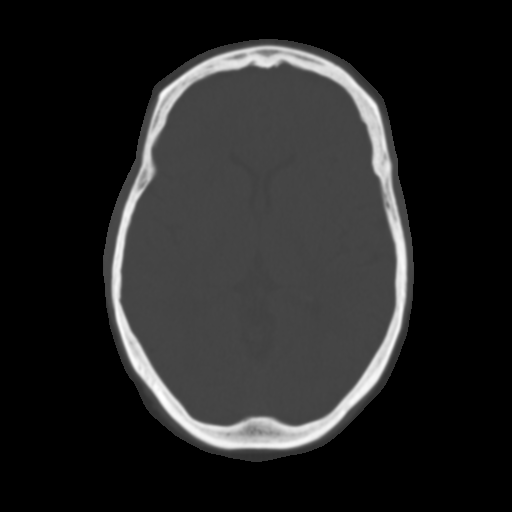
[im 17/31  brain]
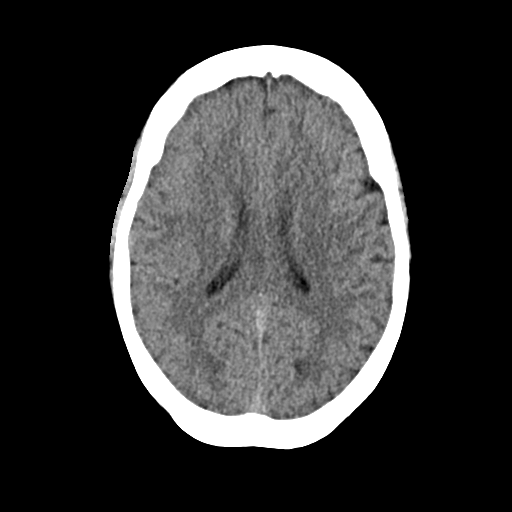
[im 20/31  brain]
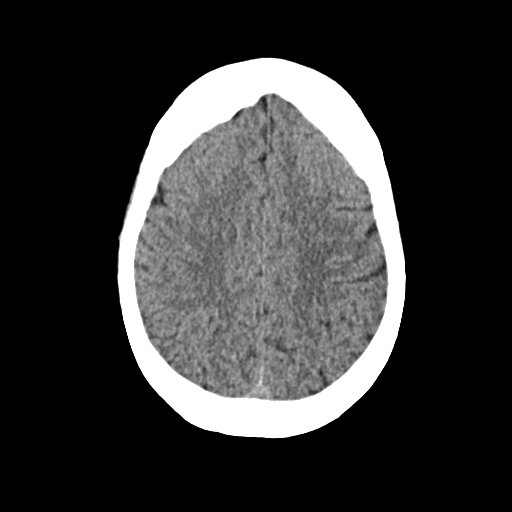
[im 23/31  brain]
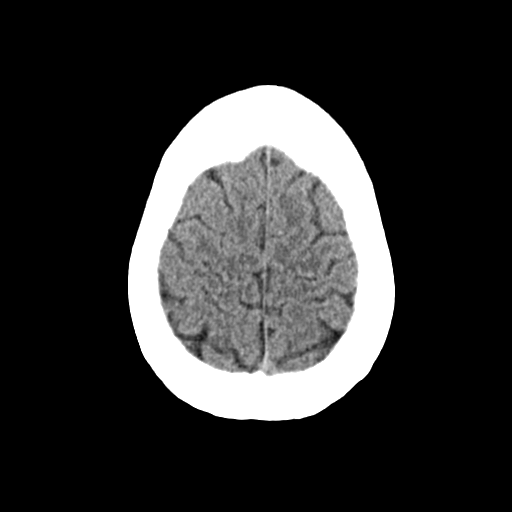
[im 25/31  brain]
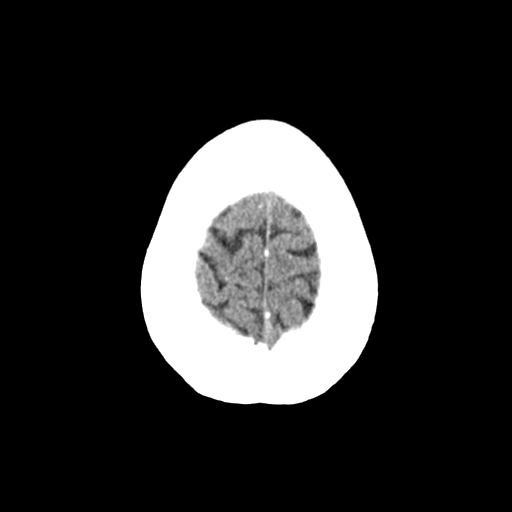
[im 25/31  bone]
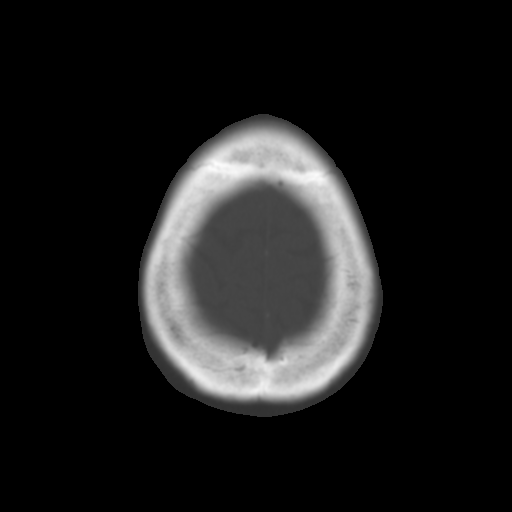
[im 28/31  brain]
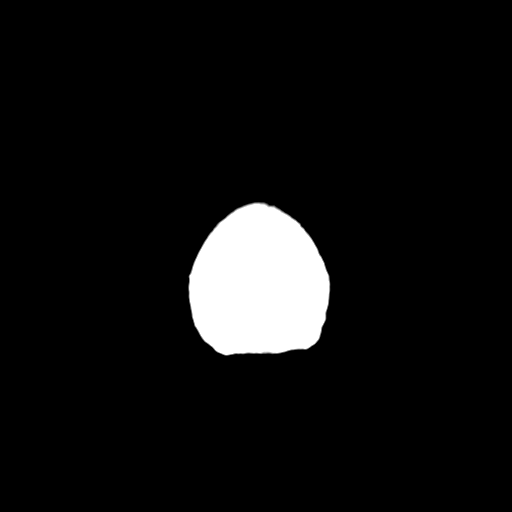

[Series 4: coronal soft · coronal · 0.35mm/px · 3 of 69 slices shown]
[im 23/69  brain]
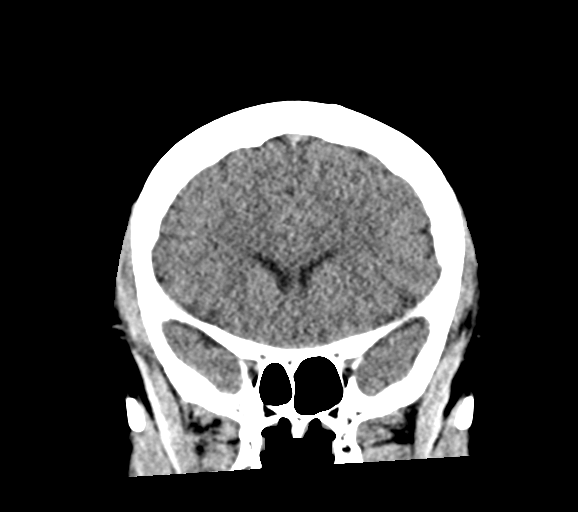
[im 31/69  brain]
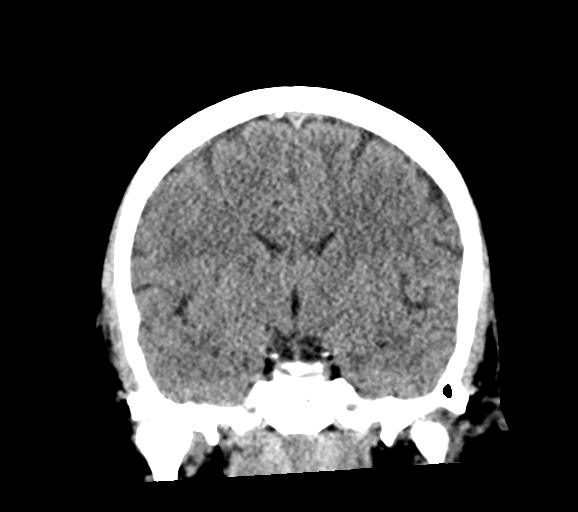
[im 38/69  brain]
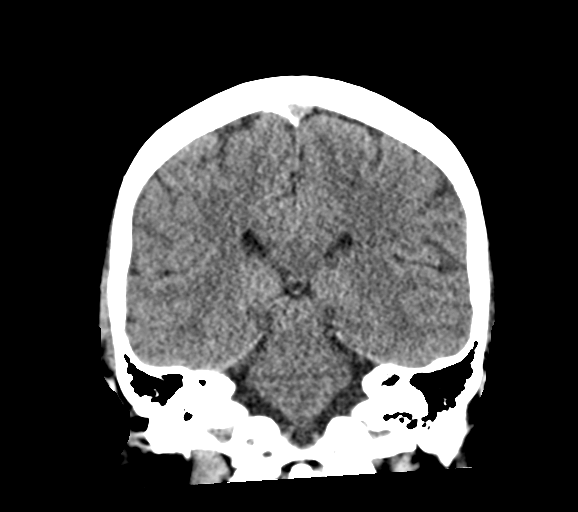

[Series 5: sag soft · sagittal · 0.35mm/px · 3 of 56 slices shown]
[im 19/56  brain]
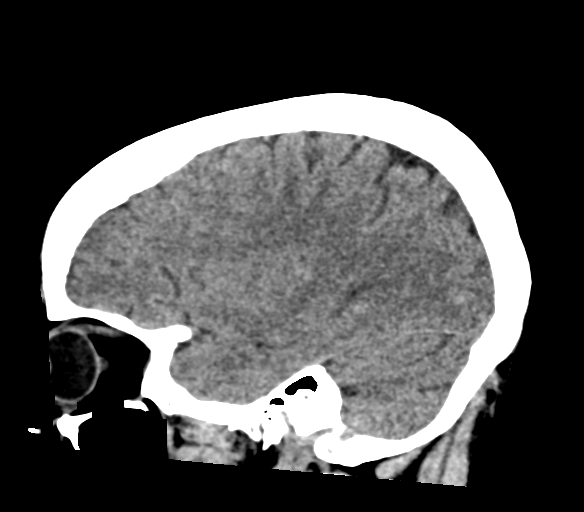
[im 28/56  brain]
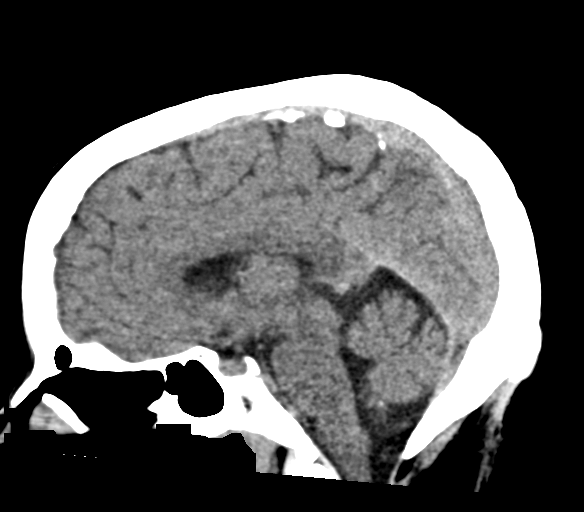
[im 37/56  brain]
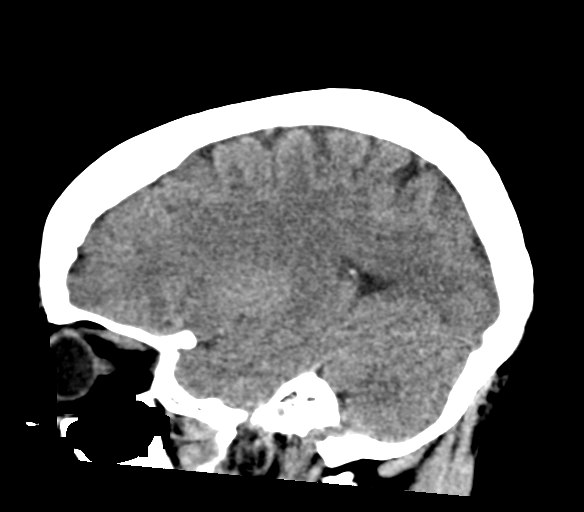

[16 of 47 positions shown; findings below may reference images not displayed]

FINDINGS: Brain: No evidence of acute infarction, hemorrhage, hydrocephalus,
extra-axial collection or mass lesion/mass effect.

Vascular: No hyperdense vessel or unexpected calcification.

Skull: Normal. Negative for fracture or focal lesion.

Sinuses/Orbits: No acute finding.

Other: None.
IMPRESSION: No acute intracranial abnormalities. Normal brain.

## 2021-12-21 ENCOUNTER — Other Ambulatory Visit: Payer: Self-pay

## 2021-12-21 ENCOUNTER — Encounter (HOSPITAL_BASED_OUTPATIENT_CLINIC_OR_DEPARTMENT_OTHER): Payer: Self-pay

## 2021-12-21 ENCOUNTER — Emergency Department (HOSPITAL_BASED_OUTPATIENT_CLINIC_OR_DEPARTMENT_OTHER)
Admission: EM | Admit: 2021-12-21 | Discharge: 2021-12-21 | Disposition: A | Discharge status: Home / Self Care | Payer: Managed Care, Other (non HMO) | Attending: Emergency Medicine | Admitting: Emergency Medicine

## 2021-12-21 DIAGNOSIS — R059 Cough, unspecified: Secondary | ICD-10-CM | POA: Diagnosis present

## 2021-12-21 DIAGNOSIS — Z5321 Procedure and treatment not carried out due to patient leaving prior to being seen by health care provider: Secondary | ICD-10-CM | POA: Diagnosis not present

## 2021-12-21 DIAGNOSIS — Z20822 Contact with and (suspected) exposure to covid-19: Secondary | ICD-10-CM | POA: Diagnosis not present

## 2021-12-21 LAB — RESP PANEL BY RT-PCR (FLU A&B, COVID) ARPGX2
Influenza A by PCR: NEGATIVE
Influenza B by PCR: NEGATIVE
SARS Coronavirus 2 by RT PCR: NEGATIVE

## 2021-12-21 NOTE — ED Triage Notes (Signed)
Pt c/o flu like sx day 2-NAD-steady gait 

## 2022-02-13 ENCOUNTER — Encounter: Payer: Self-pay | Admitting: *Deleted

## 2023-04-02 ENCOUNTER — Encounter: Payer: Self-pay | Admitting: *Deleted
